# Patient Record
Sex: Male | Born: 1965
Health system: Southern US, Community
[De-identification: ages and names within clinical notes are randomized; demographics above are authoritative.]

## PROBLEM LIST (undated history)

## (undated) DIAGNOSIS — D839 Common variable immunodeficiency, unspecified: Secondary | ICD-10-CM

## (undated) DIAGNOSIS — D819 Combined immunodeficiency, unspecified: Secondary | ICD-10-CM

## (undated) DIAGNOSIS — M6530 Trigger finger, unspecified finger: Secondary | ICD-10-CM

## (undated) DIAGNOSIS — K219 Gastro-esophageal reflux disease without esophagitis: Secondary | ICD-10-CM

## (undated) DIAGNOSIS — N200 Calculus of kidney: Secondary | ICD-10-CM

## (undated) DIAGNOSIS — M5430 Sciatica, unspecified side: Secondary | ICD-10-CM

## (undated) DIAGNOSIS — R5383 Other fatigue: Secondary | ICD-10-CM

## (undated) DIAGNOSIS — G894 Chronic pain syndrome: Secondary | ICD-10-CM

## (undated) DIAGNOSIS — M92529 Juvenile osteochondrosis of tibia tubercle, unspecified leg: Secondary | ICD-10-CM

## (undated) DIAGNOSIS — E039 Hypothyroidism, unspecified: Secondary | ICD-10-CM

## (undated) DIAGNOSIS — I499 Cardiac arrhythmia, unspecified: Secondary | ICD-10-CM

## (undated) DIAGNOSIS — K760 Fatty (change of) liver, not elsewhere classified: Secondary | ICD-10-CM

## (undated) DIAGNOSIS — K117 Disturbances of salivary secretion: Secondary | ICD-10-CM

## (undated) DIAGNOSIS — F418 Other specified anxiety disorders: Secondary | ICD-10-CM

## (undated) DIAGNOSIS — R918 Other nonspecific abnormal finding of lung field: Secondary | ICD-10-CM

## (undated) DIAGNOSIS — G4733 Obstructive sleep apnea (adult) (pediatric): Secondary | ICD-10-CM

## (undated) DIAGNOSIS — M9250 Juvenile osteochondrosis of tibia and fibula, unspecified leg: Secondary | ICD-10-CM

## (undated) DIAGNOSIS — M722 Plantar fascial fibromatosis: Secondary | ICD-10-CM

## (undated) DIAGNOSIS — D539 Nutritional anemia, unspecified: Secondary | ICD-10-CM

## (undated) DIAGNOSIS — C8590 Non-Hodgkin lymphoma, unspecified, unspecified site: Secondary | ICD-10-CM

## (undated) DIAGNOSIS — E7800 Pure hypercholesterolemia, unspecified: Secondary | ICD-10-CM

## (undated) DIAGNOSIS — D800 Hereditary hypogammaglobulinemia: Secondary | ICD-10-CM

## (undated) DIAGNOSIS — J45909 Unspecified asthma, uncomplicated: Secondary | ICD-10-CM

## (undated) DIAGNOSIS — R682 Dry mouth, unspecified: Secondary | ICD-10-CM

## (undated) DIAGNOSIS — J449 Chronic obstructive pulmonary disease, unspecified: Secondary | ICD-10-CM

## (undated) HISTORY — PX: OTHER SURGICAL HISTORY: SHX169

## (undated) HISTORY — PX: TYMPANOSTOMY TUBE PLACEMENT: SHX32

## (undated) HISTORY — DX: Other nonspecific abnormal finding of lung field: R91.8

## (undated) HISTORY — DX: Combined immunodeficiency, unspecified: D81.9

## (undated) HISTORY — DX: Dry mouth, unspecified: R68.2

## (undated) HISTORY — DX: Pure hypercholesterolemia, unspecified: E78.00

## (undated) HISTORY — DX: Chronic pain syndrome: G89.4

## (undated) HISTORY — DX: Other specified anxiety disorders: F41.8

## (undated) HISTORY — DX: Other fatigue: R53.83

## (undated) HISTORY — DX: Chronic obstructive pulmonary disease, unspecified: J44.9

## (undated) HISTORY — DX: Fatty (change of) liver, not elsewhere classified: K76.0

## (undated) HISTORY — DX: Hypothyroidism, unspecified: E03.9

## (undated) HISTORY — DX: Non-Hodgkin lymphoma, unspecified, unspecified site: C85.90

## (undated) HISTORY — DX: Juvenile osteochondrosis of tibia and fibula, unspecified leg: M92.50

## (undated) HISTORY — DX: Juvenile osteochondrosis of tibia tubercle, unspecified leg: M92.529

## (undated) HISTORY — DX: Cardiac arrhythmia, unspecified: I49.9

## (undated) HISTORY — DX: Sciatica, unspecified side: M54.30

## (undated) HISTORY — PX: LYMPHADENECTOMY: SHX15

## (undated) HISTORY — DX: Plantar fascial fibromatosis: M72.2

## (undated) HISTORY — DX: Common variable immunodeficiency, unspecified: D83.9

## (undated) HISTORY — DX: Unspecified asthma, uncomplicated: J45.909

## (undated) HISTORY — DX: Calculus of kidney: N20.0

## (undated) HISTORY — DX: Gastro-esophageal reflux disease without esophagitis: K21.9

## (undated) HISTORY — PX: LIVER SURGERY: SHX698

## (undated) HISTORY — DX: Trigger finger, unspecified finger: M65.30

## (undated) HISTORY — DX: Disturbances of salivary secretion: K11.7

## (undated) HISTORY — DX: Obstructive sleep apnea (adult) (pediatric): G47.33

## (undated) HISTORY — DX: Hereditary hypogammaglobulinemia: D80.0

## (undated) HISTORY — DX: Nutritional anemia, unspecified: D53.9

---

## 2012-04-07 ENCOUNTER — Other Ambulatory Visit (HOSPITAL_COMMUNITY)
Admission: RE | Admit: 2012-04-07 | Discharge: 2012-04-07 | Disposition: A | Discharge status: Home / Self Care | Payer: Medicare PPO | Source: Ambulatory Visit | Attending: Pathology | Admitting: Pathology

## 2012-04-07 DIAGNOSIS — R5381 Other malaise: Secondary | ICD-10-CM | POA: Insufficient documentation

## 2012-04-07 DIAGNOSIS — D849 Immunodeficiency, unspecified: Secondary | ICD-10-CM | POA: Insufficient documentation

## 2012-04-07 DIAGNOSIS — R5383 Other fatigue: Secondary | ICD-10-CM | POA: Insufficient documentation

## 2012-11-12 ENCOUNTER — Other Ambulatory Visit: Payer: Self-pay | Admitting: Family Medicine

## 2012-11-16 ENCOUNTER — Other Ambulatory Visit: Payer: Self-pay | Admitting: Family Medicine

## 2013-10-07 DIAGNOSIS — M79609 Pain in unspecified limb: Secondary | ICD-10-CM | POA: Insufficient documentation

## 2014-03-07 DIAGNOSIS — J4550 Severe persistent asthma, uncomplicated: Secondary | ICD-10-CM | POA: Insufficient documentation

## 2014-03-07 DIAGNOSIS — M549 Dorsalgia, unspecified: Secondary | ICD-10-CM | POA: Insufficient documentation

## 2014-03-07 DIAGNOSIS — G4700 Insomnia, unspecified: Secondary | ICD-10-CM | POA: Insufficient documentation

## 2014-03-07 DIAGNOSIS — J479 Bronchiectasis, uncomplicated: Secondary | ICD-10-CM | POA: Insufficient documentation

## 2014-03-07 DIAGNOSIS — D649 Anemia, unspecified: Secondary | ICD-10-CM | POA: Insufficient documentation

## 2014-03-07 DIAGNOSIS — E039 Hypothyroidism, unspecified: Secondary | ICD-10-CM | POA: Insufficient documentation

## 2014-03-07 DIAGNOSIS — F419 Anxiety disorder, unspecified: Secondary | ICD-10-CM

## 2014-03-07 DIAGNOSIS — K219 Gastro-esophageal reflux disease without esophagitis: Secondary | ICD-10-CM | POA: Insufficient documentation

## 2014-03-07 DIAGNOSIS — F329 Major depressive disorder, single episode, unspecified: Secondary | ICD-10-CM | POA: Insufficient documentation

## 2014-03-07 DIAGNOSIS — G894 Chronic pain syndrome: Secondary | ICD-10-CM | POA: Insufficient documentation

## 2014-03-07 DIAGNOSIS — J45909 Unspecified asthma, uncomplicated: Secondary | ICD-10-CM | POA: Insufficient documentation

## 2014-03-07 DIAGNOSIS — M5430 Sciatica, unspecified side: Secondary | ICD-10-CM | POA: Insufficient documentation

## 2014-06-08 DIAGNOSIS — M1990 Unspecified osteoarthritis, unspecified site: Secondary | ICD-10-CM | POA: Insufficient documentation

## 2014-07-31 ENCOUNTER — Encounter: Payer: Self-pay | Admitting: Critical Care Medicine

## 2014-08-01 ENCOUNTER — Encounter: Payer: Self-pay | Admitting: Critical Care Medicine

## 2014-08-01 ENCOUNTER — Ambulatory Visit (INDEPENDENT_AMBULATORY_CARE_PROVIDER_SITE_OTHER): Payer: Medicare PPO | Admitting: Critical Care Medicine

## 2014-08-01 VITALS — BP 112/66 | HR 91 | Temp 97.7°F | Ht 66.0 in | Wt 189.4 lb

## 2014-08-01 DIAGNOSIS — D819 Combined immunodeficiency, unspecified: Secondary | ICD-10-CM | POA: Insufficient documentation

## 2014-08-01 DIAGNOSIS — D839 Common variable immunodeficiency, unspecified: Secondary | ICD-10-CM

## 2014-08-01 DIAGNOSIS — K760 Fatty (change of) liver, not elsewhere classified: Secondary | ICD-10-CM | POA: Insufficient documentation

## 2014-08-01 DIAGNOSIS — R05 Cough: Secondary | ICD-10-CM

## 2014-08-01 DIAGNOSIS — R059 Cough, unspecified: Secondary | ICD-10-CM

## 2014-08-01 DIAGNOSIS — K219 Gastro-esophageal reflux disease without esophagitis: Secondary | ICD-10-CM

## 2014-08-01 DIAGNOSIS — E7800 Pure hypercholesterolemia, unspecified: Secondary | ICD-10-CM | POA: Insufficient documentation

## 2014-08-01 DIAGNOSIS — J329 Chronic sinusitis, unspecified: Secondary | ICD-10-CM

## 2014-08-01 MED ORDER — FLUTICASONE PROPIONATE 50 MCG/ACT NA SUSP: 2.0000 | Freq: Two times a day (BID) | NASAL | Status: DC

## 2014-08-01 MED ORDER — DOXYCYCLINE HYCLATE 100 MG PO TABS: 100.0000 mg | ORAL_TABLET | Freq: Two times a day (BID) | ORAL | Status: DC

## 2014-08-01 NOTE — Assessment & Plan Note (Signed)
Severe gastroesophageal reflux disease exacerbating cough Will increase in acid program and have given patient instructions as to proper reflux diet

## 2014-08-01 NOTE — Patient Instructions (Signed)
Stop Voltaren Stop Qvar for now Stay on symbicort, use new spacer Stay on protonix, use 1/2 hour before meals Stay on ranitidine at bedtime Take doxycycline 127m twice daily for 10days for sinus infection Use NMilta DeitersMed sinus rinse twice a day for 10days, use distilled water, not tap water A CT Sinus will be obtained  Pulmonary function studies will be obtained Labs : IgG, IgA, IgM, IgE and allergy panel, alpha one anti trypsin levels An overnight oxygen study will be obtained on Room air Return 6 weeks

## 2014-08-01 NOTE — Assessment & Plan Note (Signed)
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

## 2014-08-01 NOTE — Progress Notes (Signed)
Subjective:    Patient ID: Isaac Walsh, male    DOB: 1965/12/31, 48 y.o.   MRN: [id number]  HPI Comments: Chronic cough x 6 months. Not as bad now as before but worse off ABX.  This past year Rx Hyzentra for three years.  Prior hx of PNA 6-7 x years None since started Hyzentra.  But pt ill in 01/2014. Since then dyspnea and cough.  Recently increased to 139m once a week.  Bruising across ant abdomen.    Rx levaquin/ zpak etc/ avelox and just off biaxin two days ago. Now on Breo powder x 15 days. Symbicort and singulair and qvar  Cough This is a chronic problem. The current episode started more than 1 year ago. The problem has been gradually improving. The problem occurs every few minutes. The cough is productive of sputum (1.5 weeks ago was thick bright yellow to white now less ). Associated symptoms include chest pain, ear congestion, ear pain, a fever, headaches, heartburn, nasal congestion, postnasal drip, rhinorrhea, a sore throat, shortness of breath and wheezing. Pertinent negatives include no chills or hemoptysis. The symptoms are aggravated by fumes and dust. He has tried steroid inhaler and a beta-agonist inhaler for the symptoms. The treatment provided no relief. His past medical history is significant for asthma, bronchitis, COPD, environmental allergies and pneumonia. There is no history of bronchiectasis or emphysema. skin tests neg for allergies  on oxygen 3L 24/7 . X 241yr Past Medical History  Diagnosis Date  . Abnormal heart rhythm   . Hypothyroidism   . Trigger finger   . COPD (chronic obstructive pulmonary disease)   . Chronic pain syndrome   . Common variable immunodeficiency   . Congenital dysgammaglobulinemia   . Depression with anxiety   . Fatigue   . GERD (gastroesophageal reflux disease)   . Hypercholesterolemia   . Nonalcoholic fatty liver disease   . OSA (obstructive sleep apnea)   . Osgood-Schlatter's disease   . Other nonspecific abnormal finding  of lung field   . SCID (severe combined immunodeficiency disease)   . Unspecified deficiency anemia   . Xerostomia   . Renal stones   . Plantar fasciitis   . Sciatica   . Lymphoma     as a child     Family History  Problem Relation Age of Onset  . Heart disease Father   . Colon cancer Father   . Stroke      grandfather     History   Social History  . Marital Status: Single    Spouse Name: N/A    Number of Children: N/A  . Years of Education: N/A   Occupational History  . Not on file.   Social History Main Topics  . Smoking status: Former Smoker -- 1.00 packs/day for 10 years    Types: Cigarettes    Quit date: 12/29/1994  . Smokeless tobacco: Former [employer] developer  Types: [place]: 12/29/1978  . Alcohol Use: Yes     Comment: 2-3 beers per wk  . Drug Use: No  . Sexual Activity: Not on file   Other Topics Concern  . Not on file   Social History Narrative  . No narrative on file     Allergies  Allergen Reactions  . Androgel Pump [Testosterone]     rash  . Ceclor [Cefaclor]   . Celestone [Betamethasone]   . Cephalosporins Hives  . Claritin [Loratadine]   . Corticosteroids Hives  .  Flumist [Flu Virus Vaccine]     Pt reports he can tolerate flu shot but not mist  . Naprosyn [Naproxen]   . Tizanidine   . Toradol [Ketorolac Tromethamine]   . Vicodin [Hydrocodone-Acetaminophen]   . Rocephin [Ceftriaxone] Hives and Rash     Outpatient Prescriptions Prior to Visit  Medication Sig Dispense Refill  . acyclovir (ZOVIRAX) 400 MG tablet Take 400 mg by mouth 5 (five) times daily. As needed      . budesonide-formoterol (SYMBICORT) 160-4.5 MCG/ACT inhaler Inhale 2 puffs into the lungs 2 (two) times daily.      . carisoprodol (SOMA) 350 MG tablet Take 350 mg by mouth every 6 (six) hours as needed for muscle spasms.       . Fe Fum-FA-B Cmp-C-Zn-Mg-Mn-Cu (HEMATINIC PLUS COMPLEX) 106-1 MG TABS Take 1 capsule by mouth daily.      . finasteride (PROPECIA) 1 MG tablet  Take 1 mg by mouth daily.      Marland Kitchen ipratropium-albuterol (DUONEB) 0.5-2.5 (3) MG/3ML SOLN Take 3 mLs by nebulization every 6 (six) hours as needed.      Marland Kitchen levothyroxine (SYNTHROID, LEVOTHROID) 100 MCG tablet Take 100 mcg by mouth daily before breakfast.      . LORazepam (ATIVAN) 1 MG tablet Take 1 mg by mouth at bedtime.      . OxyCODONE (OXYCONTIN) 20 mg T12A 12 hr tablet Take 20 mg by mouth 3 (three) times daily.       . pantoprazole (PROTONIX) 40 MG tablet Take 40 mg by mouth 2 (two) times daily.      . ranitidine (ZANTAC) 150 MG capsule Take 300 mg by mouth every evening.      . sertraline (ZOLOFT) 100 MG tablet Take 100 mg by mouth daily.      . traZODone (DESYREL) 100 MG tablet Take 200 mg by mouth at bedtime.      Marland Kitchen zolpidem (AMBIEN) 10 MG tablet Take 10 mg by mouth at bedtime.       . montelukast (SINGULAIR) 10 MG tablet Take 10 mg by mouth at bedtime.      . beclomethasone (QVAR) 40 MCG/ACT inhaler Inhale 2 puffs into the lungs 4 (four) times daily.      . Diclofenac-Misoprostol (ARTHROTEC) 50-0.2 MG TBEC Take 1 tablet by mouth daily.      Marland Kitchen oxyCODONE-acetaminophen (PERCOCET) 10-325 MG per tablet Take 1 tablet by mouth every 6 (six) hours as needed for pain.       No facility-administered medications prior to visit.      Review of Systems  Constitutional: Positive for fever and fatigue. Negative for chills.  HENT: Positive for ear pain, postnasal drip, rhinorrhea, sinus pressure, sore throat, trouble swallowing and voice change.   Respiratory: Positive for cough, chest tightness, shortness of breath and wheezing. Negative for hemoptysis.   Cardiovascular: Positive for chest pain.  Gastrointestinal: Positive for heartburn.       Severe gerd   Allergic/Immunologic: Positive for environmental allergies.  Neurological: Positive for headaches.       Objective:   Physical Exam  Filed Vitals:   08/01/14 1529  BP: 112/66  Pulse: 91  Temp: 97.7 F (36.5 C)  TempSrc: Oral    Height: _0  (1.676 m)  Weight: 189 lb 6.4 oz (85.911 kg)  SpO2: 97%    Gen: Pleasant, well-nourished, in no distress,  normal affect  ENT: No lesions,  mouth clear,  oropharynx clear, no postnasal drip  Neck: No JVD, no TMG,  no carotid bruits  Lungs: No use of accessory muscles, no dullness to percussion, distant breath sounds and coarse in nature   Cardiovascular: RRR, heart sounds normal, no murmur or gallops, no peripheral edema  Abdomen: soft and NT, no HSM,  BS normal  Musculoskeletal: No deformities, no cyanosis or clubbing  Neuro: alert, non focal  Skin: Warm, no lesions or rashes  No results found.  CT scan of chest is reviewed and reveals right middle lobe and lingular scarring but no mass and no evidence of bronchiectasis  Incomplete database, need to obtain records from allergy office      Assessment & Plan:   Cough Cyclical cough with airway reactivity and associated airflow obstruction. Chronic scarring in right middle and left upper lung zones from prior pneumonia. History of immunodeficiency with combined variable immune globulin deficiencies.  Significant reflux component.  Current acute sinusitis and CT scan evidence of prior chronic sinusitis.  Prior smoking-induced lung disease now off tobacco products at this time.   Nonsteroidal anti-inflammatories are likely exacerbating cough by mechanism of increasing reflux disease Multiple inhaled steroids are also in increasing cough by upper airway irritation We need records from the allergy office Plan Stop Voltaren Stop Qvar for now Stay on symbicort, use new spacer Stay on protonix, use 1/2 hour before meals Stay on ranitidine at bedtime Take doxycycline 177m twice daily for 10days for sinus infection Use Neil Med sinus rinse twice a day for 10days, use distilled water, not tap water A CT Sinus will be obtained  Pulmonary function studies will be obtained Labs : IgG, IgA, IgM, IgE and allergy panel,  alpha one anti trypsin levels An overnight oxygen study will be obtained on Room air Return 6 weeks    SCID (severe combined immunodeficiency disease) 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 acyclovir (ZOVIRAX) 400 MG tablet Take 400 mg by mouth 5 (five) times daily. As needed  . budesonide-formoterol (SYMBICORT) 160-4.5 MCG/ACT inhaler Inhale 2 puffs into the lungs 2 (two) times daily.  . carisoprodol (SOMA) 350 MG tablet Take 350 mg by mouth every 6 (six) hours as needed for muscle spasms.   . Fe Fum-FA-B Cmp-C-Zn-Mg-Mn-Cu (HEMATINIC PLUS COMPLEX) 106-1 MG TABS Take 1 capsule by mouth daily.  . finasteride (PROPECIA) 1 MG tablet Take 1 mg by mouth daily.  . Immune Globulin, Human, (HIZENTRA [employer]) Inject into the skin once a week.  .Marland Kitchenipratropium-albuterol (DUONEB) 0.5-2.5 (3) MG/3ML SOLN Take 3 mLs by nebulization every 6 (six) hours as needed.  .Marland Kitchenlevothyroxine (SYNTHROID, LEVOTHROID) 100 MCG tablet Take 100 mcg by mouth daily before breakfast.  . LORazepam (ATIVAN) 1 MG tablet Take 1 mg by mouth at bedtime.  . OxyCODONE (OXYCONTIN) 20 mg T12A 12 hr tablet Take 20 mg by mouth 3 (three) times daily.   . Oxycodone HCl 10 MG TABS Take 10 mg by mouth 4 (four) times daily.  . pantoprazole (PROTONIX) 40 MG tablet Take 40 mg by mouth 2 (two) times daily.  .Vladimir FasterGlycol-Propyl Glycol (SYSTANE OP) Apply to eye every 4 (four) hours.  . ranitidine (ZANTAC) 150 MG capsule Take 300 mg by mouth every evening.  . sertraline (ZOLOFT)  100 MG tablet Take 100 mg by mouth daily.  . traZODone (DESYREL) 100 MG tablet Take 200 mg by mouth at bedtime.   Marland Kitchen zolpidem (AMBIEN) 10 MG tablet Take 10 mg by mouth at bedtime.   . [DISCONTINUED] beclomethasone (QVAR) 80 MCG/ACT inhaler Inhale 2 puffs into the lungs 2 (two) times daily.  . [DISCONTINUED] diclofenac (VOLTAREN) 75 MG EC tablet Take 75 mg by mouth 2 (two) times daily.  . [DISCONTINUED] montelukast (SINGULAIR) 10 MG tablet Take 10 mg by mouth at bedtime.  Marland Kitchen doxycycline (VIBRA-TABS) 100 MG tablet Take 1 tablet (100 mg total) by mouth 2 (two) times daily.  . fluticasone (FLONASE) 50 MCG/ACT nasal spray Place 2 sprays into both nostrils 2 (two) times daily.  . [DISCONTINUED] beclomethasone (QVAR) 40 MCG/ACT inhaler Inhale 2 puffs into the lungs 4 (four) times daily.  . [DISCONTINUED] Diclofenac-Misoprostol (ARTHROTEC) 50-0.2 MG TBEC Take 1 tablet by mouth daily.  . [DISCONTINUED] oxyCODONE-acetaminophen (PERCOCET) 10-325 MG per tablet Take 1 tablet by mouth every 6 (six) hours as needed for pain.

## 2014-08-01 NOTE — Assessment & Plan Note (Signed)
Cyclical cough with airway reactivity and associated airflow obstruction. Chronic scarring in right middle and left upper lung zones from prior pneumonia. History of immunodeficiency with combined variable immune globulin deficiencies.  Significant reflux component.  Current acute sinusitis and CT scan evidence of prior chronic sinusitis.  Prior smoking-induced lung disease now off tobacco products at this time.   Nonsteroidal anti-inflammatories are likely exacerbating cough by mechanism of increasing reflux disease Multiple inhaled steroids are also in increasing cough by upper airway irritation We need records from the allergy office Plan Stop Voltaren Stop Qvar for now Stay on symbicort, use new spacer Stay on protonix, use 1/2 hour before meals Stay on ranitidine at bedtime Take doxycycline 119m twice daily for 10days for sinus infection Use NMilta DeitersMed sinus rinse twice a day for 10days, use distilled water, not tap water A CT Sinus will be obtained  Pulmonary function studies will be obtained Labs : IgG, IgA, IgM, IgE and allergy panel, alpha one anti trypsin levels An overnight oxygen study will be obtained on Room air Return 6 weeks

## 2014-08-04 ENCOUNTER — Telehealth: Payer: Self-pay | Admitting: Critical Care Medicine

## 2014-08-04 NOTE — Telephone Encounter (Signed)
Called spoke with Richard from [employer]. He reports he needs more specifics on the allergy full profile they received. They do not use this order as it is to vague. They use [facility] and they do not send labs out to [facility]. Spoke with Crystal J as Dr. Joya Gaskins has ordered these on previous patients. On previous patients that had allergy profile ordered, they had codes on their form for 002170, [id number], [phone]. Code 128786 is for IGE LEVEL Code 767209 is for Zone 3 allergens in [place] Code 470962 is for basic food allergies.  Per Richard they test for Zone 2 and/or Zone 3 allergens in [place]. If Dr. Joya Gaskins is wanting both ordered, we will need to include these codes on the orderform as well. Also on the new order form, they also need order for IgG, IgA, IgM as this is not in their records per Richard.  This has been faxed to them and crystal will discuss this with PW next week.

## 2014-08-04 NOTE — Telephone Encounter (Signed)
Called and spoke with pt and he stated that he woke up this morning with his head hurting.  He stated that he has been taking the doxy and using the nasal washes. He is not sure if the doxy is the cause of the severe head pain this morning.   He is not able to use the flonase since this causes him to have nose bleeds.    Pt wanted to see how long he should stay off of the qvar?  He has been using the symbicort.    Pt stated that he is going for his labs and PFT now, but the CT and sleep study has not been scheduled yet due to insurance issues.   PW please advise. thanks

## 2014-08-06 NOTE — Telephone Encounter (Signed)
Stay off qvar Finish doxy Keep other appts

## 2014-08-07 NOTE — Telephone Encounter (Signed)
I called made pt aware. Nothing further needed 

## 2014-08-07 NOTE — Telephone Encounter (Signed)
LMOMTCB x 1 

## 2014-08-07 NOTE — Telephone Encounter (Signed)
PW aware.

## 2014-08-07 NOTE — Telephone Encounter (Signed)
Called spoke w/ pt. Aware of PW recs. He reports since stopping the voltaren he has been experiencing stomach cramps/swelling. He reports at times it can be painful. He wants to restart this medication. Please advise thanks

## 2014-08-07 NOTE — Telephone Encounter (Signed)
i am ok with restarting the voltaren

## 2014-08-08 ENCOUNTER — Telehealth: Payer: Self-pay | Admitting: Critical Care Medicine

## 2014-08-08 ENCOUNTER — Encounter: Payer: Self-pay | Admitting: Critical Care Medicine

## 2014-08-08 MED ORDER — MOXIFLOXACIN HCL 400 MG PO TABS: 400.0000 mg | ORAL_TABLET | Freq: Every day | ORAL | Status: DC

## 2014-08-08 NOTE — Telephone Encounter (Signed)
Tell pt pfts show moderate to severe obstruction No change in medications Allergy test blood work not completely back but so far negative for severe allergies  I have reviewed Dr Lyndon Code records

## 2014-08-08 NOTE — Telephone Encounter (Signed)
Call in avelox 446m daily x 10days

## 2014-08-08 NOTE — Telephone Encounter (Signed)
Called, spoke with pt.  Informed him of below results and recs per Dr. Joya Gaskins.  He verbalized understanding.  PFT results placed in scan folder.  1.  Pt states he feels worse and doesn't feel like doxy is helping.  He started this on Aug 5 - has approx 4 days left.  C/op increased cough x 3-4 days with small amount of pale yellow mucus, "extreme" nasal stuffiness, and peircing pain and tension in back from coughing.  Pt reports doxy has not worked for him in the past.  States symptoms "WILL TURN into pna" if not tx.  Requesting further recs.  Dr. Joya Gaskins, pls advise.  Thank you. 2.  Reports CT Sinus was not done on 8/7 and was cancelled by hospital bc it is still awaiting approval from insurance co.  He also has not heard anything to have ONO done.  PCCs, will you pls assist to see where we are on this scan and the ONO order?  Thank you.  ** We are still awaiting alpha 1 results, and IgG, IgA, and IgM results.  I have orders to follow up on.

## 2014-08-08 NOTE — Telephone Encounter (Signed)
Avelox sent to [facility] in [place] - pt aware and verbalized understanding of instructions. He is aware we will be contacting him back regarding CT Sinus and ONO. PCCs, pls advise.  Thank you.

## 2014-08-09 NOTE — Telephone Encounter (Signed)
This is a [place] orders do i need to do it

## 2014-08-09 NOTE — Telephone Encounter (Signed)
Faxed ono to [facility] and i noticed ct was scheduled and precert for [facility] [phone]

## 2014-08-14 NOTE — Telephone Encounter (Signed)
Crystal those are [place] orders i don't know if they were don

## 2014-08-14 NOTE — Telephone Encounter (Signed)
Libby, do you know if ONO order was received and if CT has been scheduled?  If so, is pt aware of appt date, time, and location?

## 2014-08-18 NOTE — Telephone Encounter (Signed)
Secure msg sent to Lattie Haw and Ailene Ravel in the [place] office to check on the status of the CT Sinus and ONO.  Will await response.

## 2014-08-23 NOTE — Telephone Encounter (Signed)
Received following msg via secure email from [place] in the [place] office.  Msg dated 08/22/14:  "I contacted Marissa Calamity today to inform inform hiim of the following: CT Chest was rescheduled at [facility] for 08/25/14 at 9:15 - Pt to arrive by 8:45.  I verified that the overnight oximetry had been completed -Per patient that was done on 08/15/14.  Patient reports not feeling any better at this time despite completing the Avalox medication."    Alverda Skeans, [phone], spoke with Wells Guiles.  ONO was completed.  I have now placed these results in Dr. Bettina Gavia folder along with the below pending labs for PW to address when he returns to office.    Called, spoke with pt to follow up on symptoms reported to [place]. 1.  Pt states he doesn't feel any better after completing 10 day coarse of Avelox.  Took last dose on Friday, Aug 21.  Is coughing "all the time."  Feels cough is worse than it was when he was seen by PW.  Cough is prod with "rusty" colored mucus.  He is "completely congested" in head and chest, has SOB when walking across room and when going upstairs (feels SOB is a little worse since last OV with PW), and reports it "hurts to breath with any inhalation" - this is worse on right side.  Wheezing at times when exhales.  Also notes fatigue but is unsure if this is from a new IV med started for immunity.  Feels "hot all the time" but denies chills or fever.  Is using symbicort 160 2 puffs bid, guaifenesin 400 mg bid, a sinus medication 1-2 times daily, and duoneb prn.  Has "a little relief" when uses duoneb.  Pt states Avelox never works for him.  Reports he usually needs either levaquin or zpak.  Offered OV in [place] office as PW not in [place] office until Sept 1 and symptoms have worsened  Pt declined OV at this time - reports he is unable to come in for OV at anytime this wk.  Advised would send msg to doc of the day to address above symptoms.  Pt declined this as well.  Pt requests "not  to get too many doctors involved" and would like to wait until PW returns to office tomorrow to address message.  CT Sinus is scheduled for this Friday, Aug 28.  Pt has a pending OV with PW on 09/05/14 in [place].  Pt is aware to seek emergency care if needed. 2.  Pt reports he is allergic to Avelox.  States this caused him to have blisters on his face, and "it just doesn't work for me."  Pt would like medication added to allergy list.  This has been done.  Pt aware.   Dr. Joya Gaskins, pls advise on ONO/lab results and of any further recs for pt regarding above symptoms?  If OV is required, pt is requesting to be worked into schedule on Sept 1 in [place].  Please advise.  Thank you.

## 2014-08-24 NOTE — Telephone Encounter (Signed)
Called, spoke with pt.  Explained below to him.  He verbalized understanding and would like to speak with Dr. Joya Gaskins.  He has several questions to ask.  Pt aware PW to attempt to call him tomorrow.  CT is scheduled for 12:30 tomorrow per pt.

## 2014-08-24 NOTE — Telephone Encounter (Signed)
Per PW:  Pt's ONO is normal.  Alpha 1 antitryspin is normal.  IgG is ok.  IgA is low <6.  PW does not want to give more abx right now -- pt will need to have CT Sinus first and will proceed from there.  I will call pt to discuss later today.  PW will attempt to call pt tomorrow to follow up as well.    Results placed in scan folder.

## 2014-08-25 ENCOUNTER — Telehealth: Payer: Self-pay | Admitting: Critical Care Medicine

## 2014-08-25 MED ORDER — LEVOFLOXACIN 750 MG PO TABS: 750.0000 mg | ORAL_TABLET | Freq: Every day | ORAL | Status: DC

## 2014-08-25 NOTE — Telephone Encounter (Signed)
Called, spoke with pt.  Informed him of below per Dr. Joya Gaskins. He verbalized understanding and is aware levaquin rx sent to [facility] in [place]. Dr. Joya Gaskins, pt would like to know what his lowest o2 sat was during ONO.

## 2014-08-25 NOTE — Telephone Encounter (Signed)
I spoke to the pt earlier, let him know I reviewed his sinus ct and while there are no airfluid levels to suggest a need to surgically drain the sinuses, he does likely have some sinusitis.  I recommend 10days of levaquin 720m daily  Ok to call this in  Tell him i will confer with dr kNeldon Mcnext week

## 2014-08-25 NOTE — Telephone Encounter (Signed)
i spoke to the pt. Am awaiting Ct sinus before other abx issued.  Pt aware

## 2014-08-26 NOTE — Telephone Encounter (Signed)
About 90

## 2014-08-28 NOTE — Telephone Encounter (Signed)
lmomtcb for pt on home and cell #s

## 2014-08-29 NOTE — Telephone Encounter (Signed)
Called, spoke with pt.  Informed him of below per Dr. Joya Gaskins.  He verbalized understanding. Pt is aware of pending OV with PW on Sept 8 at 2:45 pm in [place]. He is to call office back if needed prior to appt.

## 2014-09-05 ENCOUNTER — Encounter: Payer: Self-pay | Admitting: Critical Care Medicine

## 2014-09-05 ENCOUNTER — Ambulatory Visit (INDEPENDENT_AMBULATORY_CARE_PROVIDER_SITE_OTHER): Payer: Medicare PPO | Admitting: Critical Care Medicine

## 2014-09-05 VITALS — BP 142/78 | HR 78 | Temp 97.7°F | Ht 66.0 in | Wt 192.2 lb

## 2014-09-05 DIAGNOSIS — J324 Chronic pansinusitis: Secondary | ICD-10-CM

## 2014-09-05 DIAGNOSIS — J4550 Severe persistent asthma, uncomplicated: Secondary | ICD-10-CM

## 2014-09-05 DIAGNOSIS — D802 Selective deficiency of immunoglobulin A [IgA]: Secondary | ICD-10-CM | POA: Insufficient documentation

## 2014-09-05 DIAGNOSIS — D72810 Lymphocytopenia: Secondary | ICD-10-CM | POA: Insufficient documentation

## 2014-09-05 DIAGNOSIS — J45909 Unspecified asthma, uncomplicated: Secondary | ICD-10-CM

## 2014-09-05 DIAGNOSIS — J3489 Other specified disorders of nose and nasal sinuses: Secondary | ICD-10-CM

## 2014-09-05 DIAGNOSIS — D819 Combined immunodeficiency, unspecified: Secondary | ICD-10-CM

## 2014-09-05 DIAGNOSIS — K219 Gastro-esophageal reflux disease without esophagitis: Secondary | ICD-10-CM

## 2014-09-05 DIAGNOSIS — E669 Obesity, unspecified: Secondary | ICD-10-CM | POA: Insufficient documentation

## 2014-09-05 NOTE — Patient Instructions (Addendum)
Stay on singulair and symbicort Keep sugar free candy in mouth as much as possible, train self to swallow instead of clearing throat Continue with Intravenous gamma globulin Stay on ranitidine and pantoprazole No further antibiotics for now  A referral to [facility] ent dr Melida Quitter will be made Return 2 months

## 2014-09-05 NOTE — Assessment & Plan Note (Signed)
Fixed airflow obstruction with bronchiectasis Cyclic cough Plan  offered sugar free candy to moisturize throat and encourage swallow, not cough

## 2014-09-05 NOTE — Assessment & Plan Note (Signed)
Chronic airflow obstruction and asthma , note IgE normal. Alpha one antitrypsin normal Note presence of bronchiectasis RML L lingula Plan Cont symbicort  Cont singulair Stop qvar

## 2014-09-05 NOTE — Assessment & Plan Note (Signed)
SCID with low IgG and IgA  Now on IV gamma globulin qmonthly. Chronic lymphopenia Recurrent sino-pulmonary infections Recent sinusitis on Ct Sinus and exam Cyclical cough with bronchiectasis RML, L lingula Plan Cont symbicort Cont singulair Cont gamma globulin infusions ENT referral: specific question: any value in culture of sinuses? Elimination of crusting in sinuses?  ?cause of nasal perforation. Pt denies snorting street drugs in past  Cont flonase Cont sinus rinse Hold further ABX for now Stay off Qvar Avoid systemic steroids Discussed with Dr Neldon Mc 09/05/2014: no value in a referral to [facility]

## 2014-09-05 NOTE — Progress Notes (Signed)
Subjective:    Patient ID: Isaac Walsh, male    DOB: July 18, 1966, 48 y.o.   MRN: [id number]  HPI Comments: Chronic cough x 6 months. Not as bad now as before but worse off ABX.  This past year Rx Hyzentra for three years.  Prior hx of PNA 6-7 x years None since started Hyzentra.  But pt ill in 01/2014. Since then dyspnea and cough.  Recently increased to 161m once a week.  Bruising across ant abdomen.    Rx levaquin/ zpak etc/ avelox and just off biaxin two days ago. Now on Breo powder x 15 days. Symbicort and singulair and qvar on oxygen 3L 24/7 . X 258yr 09/05/2014 Chief Complaint  Patient presents with  . 6 wk follow up    Cough is unchanged with rusty colored mucus.    At last OV we discerned: IgA undetectable IgG >1000 IgE 6, no pos allergens on RAST Sinus ct: mild sinusitis pfts : moderate obstruction: FeV1 57% 30% better with BDs ONO RA: abn, needs qhs oxygen CT chest 05/2014: RML L lingular scar, assoc  Bronchiectasis same areas, no interstital lung dz Has been on several rounds of ABX: avelox/levaquin/doxy Spoke to kozlow: [facility]  Pt still with cough.  Now on IV IgG last dose 08/11/14.    Cough is unchanged.  Not as much mucus.  Hard effort to get up mucus.  Prev sinus surgery , now with mild sinusitis on CT, has nasal perforation.    Review of Systems  Constitutional: Positive for fatigue.  HENT: Positive for sinus pressure, trouble swallowing and voice change.   Respiratory: Positive for chest tightness.   Gastrointestinal:       Severe gerd        Objective:   Physical Exam   Filed Vitals:   09/05/14 1451  BP: 142/78  Pulse: 78  Temp: 97.7 F (36.5 C)  TempSrc: Oral  Height: _0  (1.676 m)  Weight: 192 lb 3.2 oz (87.181 kg)  SpO2: 95%    Gen: Pleasant, well-nourished, in no distress,  normal affect  ENT: No lesions,  mouth clear,  oropharynx clear, no postnasal drip  Neck: No JVD, no TMG, no carotid  bruits  Lungs: No use of accessory muscles, no dullness to percussion, distant breath sounds and coarse in nature   Cardiovascular: RRR, heart sounds normal, no murmur or gallops, no peripheral edema  Abdomen: soft and NT, no HSM,  BS normal  Musculoskeletal: No deformities, no cyanosis or clubbing  Neuro: alert, non focal  Skin: Warm, no lesions or rashes  No results found.      Assessment & Plan:   SCID (severe combined immunodeficiency disease) SCID with low IgG and IgA  Now on IV gamma globulin qmonthly. Chronic lymphopenia Recurrent sino-pulmonary infections Recent sinusitis on Ct Sinus and exam Cyclical cough with bronchiectasis RML, L lingula Plan Cont symbicort Cont singulair Cont gamma globulin infusions ENT referral: specific question: any value in culture of sinuses? Elimination of crusting in sinuses?  ?cause of nasal perforation. Pt denies snorting street drugs in past  Cont flonase Cont sinus rinse Hold further ABX for now Stay off Qvar Avoid systemic steroids Discussed with Dr koNeldon Mc/07/2014: no value in a referral to [facility]   Asthma, severe persistent Chronic airflow obstruction and asthma , note IgE normal. Alpha one antitrypsin normal Note presence of bronchiectasis RML L lingula Plan Cont symbicort  Cont singulair Stop qvar  Bronchiectasis without complication Fixed airflow obstruction with bronchiectasis Cyclic cough Plan  offered sugar free candy to moisturize throat and encourage swallow, not cough  Acid reflux High level reflux exacerbating current cough  Cont ppi/h2 tx    Updated Medication List Outpatient Encounter Prescriptions as of 09/05/2014  Medication Sig  . acyclovir (ZOVIRAX) 400 MG tablet Take 400 mg by mouth 5 (five) times daily. As needed  . budesonide-formoterol (SYMBICORT) 160-4.5 MCG/ACT inhaler Inhale 2 puffs into the lungs 2 (two) times daily.  . carisoprodol (SOMA) 350 MG tablet Take 350 mg by mouth every 6 (six)  hours as needed for muscle spasms.   . diclofenac (VOLTAREN) 75 MG EC tablet Take 75 mg by mouth 2 (two) times daily.  . Fe Fum-FA-B Cmp-C-Zn-Mg-Mn-Cu (HEMATINIC PLUS COMPLEX) 106-1 MG TABS Take 1 capsule by mouth daily.  . finasteride (PROPECIA) 1 MG tablet Take 1 mg by mouth daily.  . Immune Globulin 10% 5 GM/50ML SOLN Inject 50 g into the vein every 30 (thirty) days.  Marland Kitchen ipratropium-albuterol (DUONEB) 0.5-2.5 (3) MG/3ML SOLN Take 3 mLs by nebulization every 6 (six) hours as needed.  Marland Kitchen levothyroxine (SYNTHROID, LEVOTHROID) 100 MCG tablet Take 100 mcg by mouth daily before breakfast.  . LORazepam (ATIVAN) 1 MG tablet Take 1 mg by mouth at bedtime.  . montelukast (SINGULAIR) 10 MG tablet Take 10 mg by mouth at bedtime.  . OxyCODONE (OXYCONTIN) 20 mg T12A 12 hr tablet Take 20 mg by mouth 3 (three) times daily.   . Oxycodone HCl 10 MG TABS Take 10 mg by mouth 4 (four) times daily.  . pantoprazole (PROTONIX) 40 MG tablet Take 40 mg by mouth 2 (two) times daily.  Vladimir Faster Glycol-Propyl Glycol (SYSTANE OP) Apply to eye every 4 (four) hours.  . ranitidine (ZANTAC) 150 MG capsule Take 300 mg by mouth every evening.  . sertraline (ZOLOFT) 100 MG tablet Take 50 mg by mouth daily.   . traZODone (DESYREL) 100 MG tablet Take 200 mg by mouth at bedtime.  Marland Kitchen zolpidem (AMBIEN) 10 MG tablet Take 10 mg by mouth at bedtime.   . fluticasone (FLONASE) 50 MCG/ACT nasal spray Place 2 sprays into both nostrils 2 (two) times daily.  . Immune Globulin, Human, (HIZENTRA [place]) Inject into the skin. ON HOLD for a few months per pt  . [DISCONTINUED] levofloxacin (LEVAQUIN) 750 MG tablet Take 1 tablet (750 mg total) by mouth daily.  . [DISCONTINUED] moxifloxacin (AVELOX) 400 MG tablet Take 1 tablet (400 mg total) by mouth daily.

## 2014-09-05 NOTE — Assessment & Plan Note (Signed)
High level reflux exacerbating current cough  Cont ppi/h2 tx

## 2014-11-07 ENCOUNTER — Ambulatory Visit (INDEPENDENT_AMBULATORY_CARE_PROVIDER_SITE_OTHER): Payer: Medicare PPO | Admitting: Critical Care Medicine

## 2014-11-07 ENCOUNTER — Encounter: Payer: Self-pay | Admitting: Critical Care Medicine

## 2014-11-07 VITALS — BP 120/80 | HR 86 | Temp 98.5°F | Ht 66.0 in | Wt 195.8 lb

## 2014-11-07 DIAGNOSIS — J4550 Severe persistent asthma, uncomplicated: Secondary | ICD-10-CM

## 2014-11-07 DIAGNOSIS — D802 Selective deficiency of immunoglobulin A [IgA]: Secondary | ICD-10-CM

## 2014-11-07 NOTE — Patient Instructions (Signed)
No medication changes for now Return 4 months There is very little more pulmonary can add to all the medications you are on within the constraints of your home environment and financial situation.

## 2014-11-07 NOTE — Assessment & Plan Note (Signed)
Severe persistent asthma with associated chronic bronchiectasis and immune deficiency syndrome Chronic sinusitis as well Plan Take prednisone 75m Take 4 for two days three for two days two for two days one for two days Take azithromycin 2583mTake two once then one daily until gone Stay on asmanex daily Start flonase two puff daily ea nostril Return 3 months

## 2014-11-07 NOTE — Progress Notes (Signed)
Subjective:    Patient ID: Isaac Walsh, male    DOB: 1966/06/25, 48 y.o.   MRN: [id number]  HPI Comments: Chronic cough x 6 months. Not as bad now as before but worse off ABX.  This past year Rx Hyzentra for three years.  Prior hx of PNA 6-7 x years None since started Hyzentra.  But pt ill in 01/2014. Since then dyspnea and cough.  Recently increased to 12m once a week.  Bruising across ant abdomen.    Rx levaquin/ zpak etc/ avelox and just off biaxin two days ago. Now on Breo powder x 15 days. Symbicort and singulair and qvar on oxygen 3L 24/7 . X 263yr 09/05/2014 Chief Complaint  Patient presents with  . 6 wk follow up    Cough is unchanged with rusty colored mucus.    At last OV we discerned: IgA undetectable IgG >1000 IgE 6, no pos allergens on RAST Sinus ct: mild sinusitis pfts : moderate obstruction: FeV1 57% 30% better with BDs ONO RA: abn, needs qhs oxygen CT chest 05/2014: RML L lingular scar, assoc  Bronchiectasis same areas, no interstital lung dz Has been on several rounds of ABX: avelox/levaquin/doxy Spoke to kozlow: does not feel [facility]  Pt still with cough.  Now on IV IgG last dose 08/11/14.    Cough is unchanged.  Not as much mucus.  Hard effort to get up mucus.  Prev sinus surgery , now with mild sinusitis on CT, has nasal perforation.  11/07/2014 Chief Complaint  Patient presents with  . Follow-up    Received IV tx yesterday,c/o sob worse x 2-3 days,finished Levaquin 1 wk. ago (primary MD),dry mouth,sweats,cough-thick,brown,streaks of bld. when blows nose at times,sinus pr. on left,ear on left side hurts, not sleeping well   [place] recommended tobramycin/nasal steroid irrigation of sinuses, could not afford.  Pt did the 45$ and helped a bit. Just saw PCP and Rx levaquin helped while on this and then relapsed.  Coughs more at home than other places. Just had gamma globulin injection 11/06/14 per  Kozlow Note this patient does live in a basement environment that quite wet and moldy Patient saw ENT but could not afford the steroid antibiotic irrigation that was prescribed The patient maintains inhaled medications  Review of Systems  Constitutional: Positive for fatigue.  HENT: Positive for sinus pressure, trouble swallowing and voice change.   Respiratory: Positive for chest tightness.   Gastrointestinal:       Severe gerd        Objective:   Physical Exam  Filed Vitals:   11/07/14 1204  BP: 120/80  Pulse: 86  Temp: 98.5 F (36.9 C)  TempSrc: Oral  Height: _0  (1.676 m)  Weight: 195 lb 12.8 oz (88.814 kg)  SpO2: 92%    Gen: Pleasant, well-nourished, in no distress,  normal affect  ENT: No lesions,  mouth clear,  oropharynx clear, no postnasal drip  Neck: No JVD, no TMG, no carotid bruits  Lungs: No use of accessory muscles, no dullness to percussion, distant breath sounds and coarse in nature   Cardiovascular: RRR, heart sounds normal, no murmur or gallops, no peripheral edema  Abdomen: soft and NT, no HSM,  BS normal  Musculoskeletal: No deformities, no cyanosis or clubbing  Neuro: alert, non focal  Skin: Warm, no lesions or rashes  No results found.      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 acyclovir (ZOVIRAX) 400 MG tablet Take 400 mg by mouth 5 (five) times daily. As needed  .  carisoprodol (SOMA) 350 MG tablet Take 350 mg by mouth every 6 (six) hours as needed for muscle spasms.   . diclofenac (VOLTAREN) 75 MG EC tablet Take 75 mg by mouth 2 (two) times daily.  . Fe Fum-FA-B Cmp-C-Zn-Mg-Mn-Cu (HEMATINIC PLUS COMPLEX) 106-1 MG TABS Take 1 capsule by mouth daily.  . finasteride (PROPECIA) 1 MG tablet Take 1 mg by mouth daily.  . Fluticasone Furoate-Vilanterol (BREO ELLIPTA IN) Inhale into the lungs. Take 1 puff every day  . Immune Globulin 10% 5 GM/50ML SOLN Inject 50 g into the vein every 30 (thirty) days.  . Marland Kitchenpratropium-albuterol (DUONEB) 0.5-2.5 (3) MG/3ML SOLN Take 3 mLs by nebulization every 6 (six) hours as needed.  . Marland Kitchenevothyroxine (SYNTHROID, LEVOTHROID) 100 MCG tablet Take 100 mcg by mouth daily before breakfast.  . LORazepam (ATIVAN) 1 MG tablet Take 1 mg by mouth at bedtime.  . montelukast (SINGULAIR) 10 MG tablet Take 10 mg by mouth at bedtime.  . OxyCODONE (OXYCONTIN) 20 mg T12A 12 hr tablet Take 20 mg by mouth 3 (three) times daily.   . Oxycodone HCl 10 MG TABS Take 10 mg by mouth 4 (four) times daily.  . pantoprazole (PROTONIX) 40 MG tablet Take 40 mg by mouth 2 (two) times daily.  . Vladimir Fasterlycol-Propyl Glycol (SYSTANE OP) Apply to eye every 4 (four) hours.  . ranitidine (ZANTAC) 150 MG capsule Take 300 mg by mouth every evening.  . sertraline (ZOLOFT) 100 MG tablet Take 100 mg by mouth. Take 1 1/2 tablets by mouth  . traZODone (DESYREL) 100 MG tablet Take 200 mg by mouth at bedtime.  . Marland Kitchenolpidem (AMBIEN) 10 MG tablet Take 10 mg by mouth at bedtime.   . fluticasone (FLONASE) 50 MCG/ACT nasal spray Place 2 sprays into both nostrils 2 (two) times daily.  . Immune Globulin, Human, (HIZENTRA [place]) Inject into the skin. ON HOLD for a few months per pt  . [DISCONTINUED] budesonide-formoterol (SYMBICORT) 160-4.5 MCG/ACT inhaler Inhale 2 puffs into the lungs 2 (two) times daily.

## 2014-11-07 NOTE — Assessment & Plan Note (Signed)
IgA deficiency with significant immune deficient state Plan Per immunology

## 2014-12-01 ENCOUNTER — Other Ambulatory Visit: Payer: Self-pay | Admitting: Otolaryngology

## 2014-12-01 DIAGNOSIS — R1313 Dysphagia, pharyngeal phase: Secondary | ICD-10-CM

## 2014-12-08 ENCOUNTER — Ambulatory Visit
Admission: RE | Admit: 2014-12-08 | Discharge: 2014-12-08 | Disposition: A | Discharge status: Home / Self Care | Payer: Commercial Managed Care - HMO | Source: Ambulatory Visit | Attending: Otolaryngology | Admitting: Otolaryngology

## 2014-12-08 DIAGNOSIS — R1313 Dysphagia, pharyngeal phase: Secondary | ICD-10-CM

## 2015-01-03 DIAGNOSIS — D839 Common variable immunodeficiency, unspecified: Secondary | ICD-10-CM | POA: Diagnosis not present

## 2015-01-11 DIAGNOSIS — G894 Chronic pain syndrome: Secondary | ICD-10-CM | POA: Diagnosis not present

## 2015-01-11 DIAGNOSIS — M150 Primary generalized (osteo)arthritis: Secondary | ICD-10-CM | POA: Diagnosis not present

## 2015-01-11 DIAGNOSIS — M545 Low back pain: Secondary | ICD-10-CM | POA: Diagnosis not present

## 2015-01-18 DIAGNOSIS — J449 Chronic obstructive pulmonary disease, unspecified: Secondary | ICD-10-CM | POA: Diagnosis not present

## 2015-01-18 DIAGNOSIS — M542 Cervicalgia: Secondary | ICD-10-CM | POA: Diagnosis not present

## 2015-02-05 DIAGNOSIS — D839 Common variable immunodeficiency, unspecified: Secondary | ICD-10-CM | POA: Diagnosis not present

## 2015-02-12 DIAGNOSIS — D839 Common variable immunodeficiency, unspecified: Secondary | ICD-10-CM | POA: Diagnosis not present

## 2015-02-16 DIAGNOSIS — F341 Dysthymic disorder: Secondary | ICD-10-CM | POA: Diagnosis not present

## 2015-02-16 DIAGNOSIS — E780 Pure hypercholesterolemia: Secondary | ICD-10-CM | POA: Diagnosis not present

## 2015-02-16 DIAGNOSIS — F5101 Primary insomnia: Secondary | ICD-10-CM | POA: Diagnosis not present

## 2015-02-16 DIAGNOSIS — J441 Chronic obstructive pulmonary disease with (acute) exacerbation: Secondary | ICD-10-CM | POA: Diagnosis not present

## 2015-02-16 DIAGNOSIS — D838 Other common variable immunodeficiencies: Secondary | ICD-10-CM | POA: Diagnosis not present

## 2015-02-16 DIAGNOSIS — M65321 Trigger finger, right index finger: Secondary | ICD-10-CM | POA: Diagnosis not present

## 2015-02-16 DIAGNOSIS — F3131 Bipolar disorder, current episode depressed, mild: Secondary | ICD-10-CM | POA: Diagnosis not present

## 2015-02-18 DIAGNOSIS — M542 Cervicalgia: Secondary | ICD-10-CM | POA: Diagnosis not present

## 2015-02-18 DIAGNOSIS — J449 Chronic obstructive pulmonary disease, unspecified: Secondary | ICD-10-CM | POA: Diagnosis not present

## 2015-02-20 DIAGNOSIS — D839 Common variable immunodeficiency, unspecified: Secondary | ICD-10-CM | POA: Diagnosis not present

## 2015-02-21 DIAGNOSIS — R7301 Impaired fasting glucose: Secondary | ICD-10-CM | POA: Diagnosis not present

## 2015-02-21 DIAGNOSIS — E034 Atrophy of thyroid (acquired): Secondary | ICD-10-CM | POA: Diagnosis not present

## 2015-02-21 DIAGNOSIS — E780 Pure hypercholesterolemia: Secondary | ICD-10-CM | POA: Diagnosis not present

## 2015-03-01 DIAGNOSIS — D803 Selective deficiency of immunoglobulin G [IgG] subclasses: Secondary | ICD-10-CM | POA: Diagnosis not present

## 2015-03-01 DIAGNOSIS — J4540 Moderate persistent asthma, uncomplicated: Secondary | ICD-10-CM | POA: Diagnosis not present

## 2015-03-06 DIAGNOSIS — D839 Common variable immunodeficiency, unspecified: Secondary | ICD-10-CM | POA: Diagnosis not present

## 2015-03-08 DIAGNOSIS — M545 Low back pain: Secondary | ICD-10-CM | POA: Diagnosis not present

## 2015-03-08 DIAGNOSIS — M259 Joint disorder, unspecified: Secondary | ICD-10-CM | POA: Diagnosis not present

## 2015-03-08 DIAGNOSIS — G894 Chronic pain syndrome: Secondary | ICD-10-CM | POA: Diagnosis not present

## 2015-03-19 DIAGNOSIS — M542 Cervicalgia: Secondary | ICD-10-CM | POA: Diagnosis not present

## 2015-03-22 DIAGNOSIS — M542 Cervicalgia: Secondary | ICD-10-CM | POA: Diagnosis not present

## 2015-03-22 DIAGNOSIS — M9901 Segmental and somatic dysfunction of cervical region: Secondary | ICD-10-CM | POA: Diagnosis not present

## 2015-03-22 DIAGNOSIS — M545 Low back pain: Secondary | ICD-10-CM | POA: Diagnosis not present

## 2015-03-22 DIAGNOSIS — M546 Pain in thoracic spine: Secondary | ICD-10-CM | POA: Diagnosis not present

## 2015-03-22 DIAGNOSIS — M9903 Segmental and somatic dysfunction of lumbar region: Secondary | ICD-10-CM | POA: Diagnosis not present

## 2015-03-22 DIAGNOSIS — M9902 Segmental and somatic dysfunction of thoracic region: Secondary | ICD-10-CM | POA: Diagnosis not present

## 2015-04-03 DIAGNOSIS — D839 Common variable immunodeficiency, unspecified: Secondary | ICD-10-CM | POA: Diagnosis not present

## 2015-04-12 DIAGNOSIS — K219 Gastro-esophageal reflux disease without esophagitis: Secondary | ICD-10-CM | POA: Diagnosis not present

## 2015-04-12 DIAGNOSIS — J188 Other pneumonia, unspecified organism: Secondary | ICD-10-CM | POA: Diagnosis not present

## 2015-04-12 DIAGNOSIS — R202 Paresthesia of skin: Secondary | ICD-10-CM | POA: Diagnosis not present

## 2015-04-12 DIAGNOSIS — J208 Acute bronchitis due to other specified organisms: Secondary | ICD-10-CM | POA: Diagnosis not present

## 2015-04-19 DIAGNOSIS — M542 Cervicalgia: Secondary | ICD-10-CM | POA: Diagnosis not present

## 2015-04-19 DIAGNOSIS — J449 Chronic obstructive pulmonary disease, unspecified: Secondary | ICD-10-CM | POA: Diagnosis not present

## 2015-04-24 DIAGNOSIS — E538 Deficiency of other specified B group vitamins: Secondary | ICD-10-CM | POA: Diagnosis not present

## 2015-04-30 DIAGNOSIS — D839 Common variable immunodeficiency, unspecified: Secondary | ICD-10-CM | POA: Diagnosis not present

## 2015-05-01 DIAGNOSIS — E538 Deficiency of other specified B group vitamins: Secondary | ICD-10-CM | POA: Diagnosis not present

## 2015-05-08 DIAGNOSIS — D518 Other vitamin B12 deficiency anemias: Secondary | ICD-10-CM | POA: Diagnosis not present

## 2015-05-09 DIAGNOSIS — M150 Primary generalized (osteo)arthritis: Secondary | ICD-10-CM | POA: Diagnosis not present

## 2015-05-09 DIAGNOSIS — M5440 Lumbago with sciatica, unspecified side: Secondary | ICD-10-CM | POA: Diagnosis not present

## 2015-05-09 DIAGNOSIS — G894 Chronic pain syndrome: Secondary | ICD-10-CM | POA: Diagnosis not present

## 2015-05-10 DIAGNOSIS — M9903 Segmental and somatic dysfunction of lumbar region: Secondary | ICD-10-CM | POA: Diagnosis not present

## 2015-05-10 DIAGNOSIS — M5414 Radiculopathy, thoracic region: Secondary | ICD-10-CM | POA: Diagnosis not present

## 2015-05-10 DIAGNOSIS — M542 Cervicalgia: Secondary | ICD-10-CM | POA: Diagnosis not present

## 2015-05-10 DIAGNOSIS — M9901 Segmental and somatic dysfunction of cervical region: Secondary | ICD-10-CM | POA: Diagnosis not present

## 2015-05-10 DIAGNOSIS — M62830 Muscle spasm of back: Secondary | ICD-10-CM | POA: Diagnosis not present

## 2015-05-10 DIAGNOSIS — M9902 Segmental and somatic dysfunction of thoracic region: Secondary | ICD-10-CM | POA: Diagnosis not present

## 2015-05-10 DIAGNOSIS — M545 Low back pain: Secondary | ICD-10-CM | POA: Diagnosis not present

## 2015-05-16 DIAGNOSIS — E538 Deficiency of other specified B group vitamins: Secondary | ICD-10-CM | POA: Diagnosis not present

## 2015-05-19 DIAGNOSIS — M542 Cervicalgia: Secondary | ICD-10-CM | POA: Diagnosis not present

## 2015-05-19 DIAGNOSIS — J449 Chronic obstructive pulmonary disease, unspecified: Secondary | ICD-10-CM | POA: Diagnosis not present

## 2015-05-24 DIAGNOSIS — E780 Pure hypercholesterolemia: Secondary | ICD-10-CM | POA: Diagnosis not present

## 2015-05-24 DIAGNOSIS — E538 Deficiency of other specified B group vitamins: Secondary | ICD-10-CM | POA: Diagnosis not present

## 2015-05-24 DIAGNOSIS — E782 Mixed hyperlipidemia: Secondary | ICD-10-CM | POA: Diagnosis not present

## 2015-05-24 DIAGNOSIS — K219 Gastro-esophageal reflux disease without esophagitis: Secondary | ICD-10-CM | POA: Diagnosis not present

## 2015-05-24 DIAGNOSIS — F3131 Bipolar disorder, current episode depressed, mild: Secondary | ICD-10-CM | POA: Diagnosis not present

## 2015-05-24 DIAGNOSIS — R0789 Other chest pain: Secondary | ICD-10-CM | POA: Diagnosis not present

## 2015-05-24 DIAGNOSIS — R7301 Impaired fasting glucose: Secondary | ICD-10-CM | POA: Diagnosis not present

## 2015-05-24 DIAGNOSIS — F5101 Primary insomnia: Secondary | ICD-10-CM | POA: Diagnosis not present

## 2015-05-24 DIAGNOSIS — D838 Other common variable immunodeficiencies: Secondary | ICD-10-CM | POA: Diagnosis not present

## 2015-05-28 DIAGNOSIS — D839 Common variable immunodeficiency, unspecified: Secondary | ICD-10-CM | POA: Diagnosis not present

## 2015-06-01 DIAGNOSIS — R1011 Right upper quadrant pain: Secondary | ICD-10-CM | POA: Diagnosis not present

## 2015-06-01 DIAGNOSIS — R10811 Right upper quadrant abdominal tenderness: Secondary | ICD-10-CM | POA: Diagnosis not present

## 2015-06-06 DIAGNOSIS — M150 Primary generalized (osteo)arthritis: Secondary | ICD-10-CM | POA: Diagnosis not present

## 2015-06-06 DIAGNOSIS — M5440 Lumbago with sciatica, unspecified side: Secondary | ICD-10-CM | POA: Diagnosis not present

## 2015-06-06 DIAGNOSIS — G894 Chronic pain syndrome: Secondary | ICD-10-CM | POA: Diagnosis not present

## 2015-06-14 DIAGNOSIS — J019 Acute sinusitis, unspecified: Secondary | ICD-10-CM | POA: Diagnosis not present

## 2015-06-19 DIAGNOSIS — M542 Cervicalgia: Secondary | ICD-10-CM | POA: Diagnosis not present

## 2015-06-19 DIAGNOSIS — J449 Chronic obstructive pulmonary disease, unspecified: Secondary | ICD-10-CM | POA: Diagnosis not present

## 2015-06-25 DIAGNOSIS — D839 Common variable immunodeficiency, unspecified: Secondary | ICD-10-CM | POA: Diagnosis not present

## 2015-06-26 DIAGNOSIS — J449 Chronic obstructive pulmonary disease, unspecified: Secondary | ICD-10-CM | POA: Diagnosis not present

## 2015-06-26 DIAGNOSIS — E039 Hypothyroidism, unspecified: Secondary | ICD-10-CM | POA: Diagnosis not present

## 2015-06-26 DIAGNOSIS — K2950 Unspecified chronic gastritis without bleeding: Secondary | ICD-10-CM | POA: Diagnosis not present

## 2015-06-26 DIAGNOSIS — K2970 Gastritis, unspecified, without bleeding: Secondary | ICD-10-CM | POA: Diagnosis not present

## 2015-06-26 DIAGNOSIS — Z79899 Other long term (current) drug therapy: Secondary | ICD-10-CM | POA: Diagnosis not present

## 2015-06-26 DIAGNOSIS — G894 Chronic pain syndrome: Secondary | ICD-10-CM | POA: Diagnosis not present

## 2015-06-26 DIAGNOSIS — R0789 Other chest pain: Secondary | ICD-10-CM | POA: Diagnosis not present

## 2015-06-26 DIAGNOSIS — R918 Other nonspecific abnormal finding of lung field: Secondary | ICD-10-CM | POA: Diagnosis not present

## 2015-06-26 DIAGNOSIS — D839 Common variable immunodeficiency, unspecified: Secondary | ICD-10-CM | POA: Diagnosis not present

## 2015-06-26 DIAGNOSIS — R072 Precordial pain: Secondary | ICD-10-CM | POA: Diagnosis not present

## 2015-06-26 DIAGNOSIS — R079 Chest pain, unspecified: Secondary | ICD-10-CM | POA: Diagnosis not present

## 2015-06-26 DIAGNOSIS — K219 Gastro-esophageal reflux disease without esophagitis: Secondary | ICD-10-CM | POA: Diagnosis not present

## 2015-06-26 DIAGNOSIS — I200 Unstable angina: Secondary | ICD-10-CM | POA: Diagnosis not present

## 2015-06-26 DIAGNOSIS — E781 Pure hyperglyceridemia: Secondary | ICD-10-CM | POA: Diagnosis not present

## 2015-06-26 DIAGNOSIS — K760 Fatty (change of) liver, not elsewhere classified: Secondary | ICD-10-CM | POA: Diagnosis not present

## 2015-06-27 DIAGNOSIS — K2970 Gastritis, unspecified, without bleeding: Secondary | ICD-10-CM | POA: Diagnosis not present

## 2015-06-27 DIAGNOSIS — R072 Precordial pain: Secondary | ICD-10-CM | POA: Diagnosis not present

## 2015-06-27 DIAGNOSIS — K219 Gastro-esophageal reflux disease without esophagitis: Secondary | ICD-10-CM | POA: Diagnosis not present

## 2015-06-27 DIAGNOSIS — E039 Hypothyroidism, unspecified: Secondary | ICD-10-CM | POA: Diagnosis not present

## 2015-06-27 DIAGNOSIS — G894 Chronic pain syndrome: Secondary | ICD-10-CM | POA: Diagnosis not present

## 2015-06-27 DIAGNOSIS — J449 Chronic obstructive pulmonary disease, unspecified: Secondary | ICD-10-CM | POA: Diagnosis not present

## 2015-06-27 DIAGNOSIS — E781 Pure hyperglyceridemia: Secondary | ICD-10-CM | POA: Diagnosis not present

## 2015-06-27 DIAGNOSIS — K2950 Unspecified chronic gastritis without bleeding: Secondary | ICD-10-CM | POA: Diagnosis not present

## 2015-06-27 DIAGNOSIS — R079 Chest pain, unspecified: Secondary | ICD-10-CM | POA: Diagnosis not present

## 2015-06-27 DIAGNOSIS — Z79899 Other long term (current) drug therapy: Secondary | ICD-10-CM | POA: Diagnosis not present

## 2015-06-27 DIAGNOSIS — K760 Fatty (change of) liver, not elsewhere classified: Secondary | ICD-10-CM | POA: Diagnosis not present

## 2015-06-27 DIAGNOSIS — D839 Common variable immunodeficiency, unspecified: Secondary | ICD-10-CM | POA: Diagnosis not present

## 2015-06-28 DIAGNOSIS — E039 Hypothyroidism, unspecified: Secondary | ICD-10-CM | POA: Diagnosis not present

## 2015-06-28 DIAGNOSIS — E781 Pure hyperglyceridemia: Secondary | ICD-10-CM | POA: Diagnosis not present

## 2015-06-28 DIAGNOSIS — K219 Gastro-esophageal reflux disease without esophagitis: Secondary | ICD-10-CM | POA: Diagnosis not present

## 2015-06-28 DIAGNOSIS — K2970 Gastritis, unspecified, without bleeding: Secondary | ICD-10-CM | POA: Diagnosis not present

## 2015-06-28 DIAGNOSIS — Z79899 Other long term (current) drug therapy: Secondary | ICD-10-CM | POA: Diagnosis not present

## 2015-06-28 DIAGNOSIS — J449 Chronic obstructive pulmonary disease, unspecified: Secondary | ICD-10-CM | POA: Diagnosis not present

## 2015-06-28 DIAGNOSIS — G894 Chronic pain syndrome: Secondary | ICD-10-CM | POA: Diagnosis not present

## 2015-06-28 DIAGNOSIS — K760 Fatty (change of) liver, not elsewhere classified: Secondary | ICD-10-CM | POA: Diagnosis not present

## 2015-06-28 DIAGNOSIS — R072 Precordial pain: Secondary | ICD-10-CM | POA: Diagnosis not present

## 2015-07-04 DIAGNOSIS — M150 Primary generalized (osteo)arthritis: Secondary | ICD-10-CM | POA: Diagnosis not present

## 2015-07-04 DIAGNOSIS — K760 Fatty (change of) liver, not elsewhere classified: Secondary | ICD-10-CM | POA: Diagnosis not present

## 2015-07-04 DIAGNOSIS — M545 Low back pain: Secondary | ICD-10-CM | POA: Diagnosis not present

## 2015-07-04 DIAGNOSIS — R799 Abnormal finding of blood chemistry, unspecified: Secondary | ICD-10-CM | POA: Diagnosis not present

## 2015-07-04 DIAGNOSIS — R1084 Generalized abdominal pain: Secondary | ICD-10-CM | POA: Diagnosis not present

## 2015-07-04 DIAGNOSIS — G894 Chronic pain syndrome: Secondary | ICD-10-CM | POA: Diagnosis not present

## 2015-07-04 DIAGNOSIS — R5383 Other fatigue: Secondary | ICD-10-CM | POA: Diagnosis not present

## 2015-07-13 DIAGNOSIS — E538 Deficiency of other specified B group vitamins: Secondary | ICD-10-CM | POA: Diagnosis not present

## 2015-07-13 DIAGNOSIS — D649 Anemia, unspecified: Secondary | ICD-10-CM | POA: Diagnosis not present

## 2015-07-17 DIAGNOSIS — Z1211 Encounter for screening for malignant neoplasm of colon: Secondary | ICD-10-CM | POA: Diagnosis not present

## 2015-07-19 DIAGNOSIS — J449 Chronic obstructive pulmonary disease, unspecified: Secondary | ICD-10-CM | POA: Diagnosis not present

## 2015-07-19 DIAGNOSIS — J0100 Acute maxillary sinusitis, unspecified: Secondary | ICD-10-CM | POA: Diagnosis not present

## 2015-07-19 DIAGNOSIS — M542 Cervicalgia: Secondary | ICD-10-CM | POA: Diagnosis not present

## 2015-07-20 DIAGNOSIS — M9901 Segmental and somatic dysfunction of cervical region: Secondary | ICD-10-CM | POA: Diagnosis not present

## 2015-07-20 DIAGNOSIS — M5412 Radiculopathy, cervical region: Secondary | ICD-10-CM | POA: Diagnosis not present

## 2015-07-20 DIAGNOSIS — M9900 Segmental and somatic dysfunction of head region: Secondary | ICD-10-CM | POA: Diagnosis not present

## 2015-07-20 DIAGNOSIS — M9902 Segmental and somatic dysfunction of thoracic region: Secondary | ICD-10-CM | POA: Diagnosis not present

## 2015-07-20 DIAGNOSIS — M5481 Occipital neuralgia: Secondary | ICD-10-CM | POA: Diagnosis not present

## 2015-07-20 DIAGNOSIS — M62830 Muscle spasm of back: Secondary | ICD-10-CM | POA: Diagnosis not present

## 2015-07-23 DIAGNOSIS — D839 Common variable immunodeficiency, unspecified: Secondary | ICD-10-CM | POA: Diagnosis not present

## 2015-08-09 DIAGNOSIS — G894 Chronic pain syndrome: Secondary | ICD-10-CM | POA: Diagnosis not present

## 2015-08-09 DIAGNOSIS — M545 Low back pain: Secondary | ICD-10-CM | POA: Diagnosis not present

## 2015-08-09 DIAGNOSIS — M150 Primary generalized (osteo)arthritis: Secondary | ICD-10-CM | POA: Diagnosis not present

## 2015-08-10 DIAGNOSIS — R0789 Other chest pain: Secondary | ICD-10-CM | POA: Diagnosis not present

## 2015-08-10 DIAGNOSIS — K210 Gastro-esophageal reflux disease with esophagitis: Secondary | ICD-10-CM | POA: Diagnosis not present

## 2015-08-19 DIAGNOSIS — M542 Cervicalgia: Secondary | ICD-10-CM | POA: Diagnosis not present

## 2015-08-19 DIAGNOSIS — J449 Chronic obstructive pulmonary disease, unspecified: Secondary | ICD-10-CM | POA: Diagnosis not present

## 2015-08-20 DIAGNOSIS — D839 Common variable immunodeficiency, unspecified: Secondary | ICD-10-CM | POA: Diagnosis not present

## 2015-08-23 ENCOUNTER — Other Ambulatory Visit: Payer: Self-pay

## 2015-08-23 DIAGNOSIS — K259 Gastric ulcer, unspecified as acute or chronic, without hemorrhage or perforation: Secondary | ICD-10-CM | POA: Diagnosis not present

## 2015-08-23 DIAGNOSIS — G4730 Sleep apnea, unspecified: Secondary | ICD-10-CM | POA: Diagnosis not present

## 2015-08-23 DIAGNOSIS — K228 Other specified diseases of esophagus: Secondary | ICD-10-CM | POA: Diagnosis not present

## 2015-08-23 DIAGNOSIS — E780 Pure hypercholesterolemia: Secondary | ICD-10-CM | POA: Diagnosis not present

## 2015-08-23 DIAGNOSIS — J069 Acute upper respiratory infection, unspecified: Secondary | ICD-10-CM | POA: Diagnosis not present

## 2015-08-23 DIAGNOSIS — K210 Gastro-esophageal reflux disease with esophagitis: Secondary | ICD-10-CM | POA: Diagnosis not present

## 2015-08-23 DIAGNOSIS — K117 Disturbances of salivary secretion: Secondary | ICD-10-CM | POA: Diagnosis not present

## 2015-08-23 DIAGNOSIS — K3189 Other diseases of stomach and duodenum: Secondary | ICD-10-CM | POA: Diagnosis not present

## 2015-08-23 DIAGNOSIS — B3781 Candidal esophagitis: Secondary | ICD-10-CM | POA: Diagnosis not present

## 2015-08-23 DIAGNOSIS — K2950 Unspecified chronic gastritis without bleeding: Secondary | ICD-10-CM | POA: Diagnosis not present

## 2015-08-23 DIAGNOSIS — K319 Disease of stomach and duodenum, unspecified: Secondary | ICD-10-CM | POA: Diagnosis not present

## 2015-08-23 DIAGNOSIS — K253 Acute gastric ulcer without hemorrhage or perforation: Secondary | ICD-10-CM | POA: Diagnosis not present

## 2015-08-23 DIAGNOSIS — R0789 Other chest pain: Secondary | ICD-10-CM | POA: Diagnosis not present

## 2015-08-23 DIAGNOSIS — G4733 Obstructive sleep apnea (adult) (pediatric): Secondary | ICD-10-CM | POA: Diagnosis not present

## 2015-08-23 DIAGNOSIS — G8929 Other chronic pain: Secondary | ICD-10-CM | POA: Diagnosis not present

## 2015-08-29 DIAGNOSIS — R51 Headache: Secondary | ICD-10-CM | POA: Diagnosis not present

## 2015-08-29 DIAGNOSIS — E780 Pure hypercholesterolemia: Secondary | ICD-10-CM | POA: Diagnosis not present

## 2015-08-29 DIAGNOSIS — K219 Gastro-esophageal reflux disease without esophagitis: Secondary | ICD-10-CM | POA: Diagnosis not present

## 2015-08-29 DIAGNOSIS — Z23 Encounter for immunization: Secondary | ICD-10-CM | POA: Diagnosis not present

## 2015-08-29 DIAGNOSIS — E782 Mixed hyperlipidemia: Secondary | ICD-10-CM | POA: Diagnosis not present

## 2015-08-29 DIAGNOSIS — R7301 Impaired fasting glucose: Secondary | ICD-10-CM | POA: Diagnosis not present

## 2015-08-29 DIAGNOSIS — D838 Other common variable immunodeficiencies: Secondary | ICD-10-CM | POA: Diagnosis not present

## 2015-08-29 DIAGNOSIS — M542 Cervicalgia: Secondary | ICD-10-CM | POA: Diagnosis not present

## 2015-08-29 DIAGNOSIS — E034 Atrophy of thyroid (acquired): Secondary | ICD-10-CM | POA: Diagnosis not present

## 2015-08-29 DIAGNOSIS — R0789 Other chest pain: Secondary | ICD-10-CM | POA: Diagnosis not present

## 2015-08-31 DIAGNOSIS — R51 Headache: Secondary | ICD-10-CM | POA: Diagnosis not present

## 2015-09-10 DIAGNOSIS — E291 Testicular hypofunction: Secondary | ICD-10-CM

## 2015-09-10 DIAGNOSIS — B370 Candidal stomatitis: Secondary | ICD-10-CM | POA: Insufficient documentation

## 2015-09-10 DIAGNOSIS — J449 Chronic obstructive pulmonary disease, unspecified: Secondary | ICD-10-CM | POA: Insufficient documentation

## 2015-09-10 DIAGNOSIS — G4730 Sleep apnea, unspecified: Secondary | ICD-10-CM | POA: Insufficient documentation

## 2015-09-17 DIAGNOSIS — D839 Common variable immunodeficiency, unspecified: Secondary | ICD-10-CM | POA: Diagnosis not present

## 2015-09-19 DIAGNOSIS — J449 Chronic obstructive pulmonary disease, unspecified: Secondary | ICD-10-CM | POA: Diagnosis not present

## 2015-09-19 DIAGNOSIS — M542 Cervicalgia: Secondary | ICD-10-CM | POA: Diagnosis not present

## 2015-09-25 DIAGNOSIS — M542 Cervicalgia: Secondary | ICD-10-CM | POA: Diagnosis not present

## 2015-09-25 DIAGNOSIS — M545 Low back pain: Secondary | ICD-10-CM | POA: Diagnosis not present

## 2015-09-25 DIAGNOSIS — M62830 Muscle spasm of back: Secondary | ICD-10-CM | POA: Diagnosis not present

## 2015-09-25 DIAGNOSIS — M9903 Segmental and somatic dysfunction of lumbar region: Secondary | ICD-10-CM | POA: Diagnosis not present

## 2015-09-25 DIAGNOSIS — M9901 Segmental and somatic dysfunction of cervical region: Secondary | ICD-10-CM | POA: Diagnosis not present

## 2015-09-25 DIAGNOSIS — M9902 Segmental and somatic dysfunction of thoracic region: Secondary | ICD-10-CM | POA: Diagnosis not present

## 2015-10-01 DIAGNOSIS — D6489 Other specified anemias: Secondary | ICD-10-CM | POA: Diagnosis not present

## 2015-10-01 DIAGNOSIS — E034 Atrophy of thyroid (acquired): Secondary | ICD-10-CM | POA: Diagnosis not present

## 2015-10-01 DIAGNOSIS — R5383 Other fatigue: Secondary | ICD-10-CM | POA: Diagnosis not present

## 2015-10-02 DIAGNOSIS — M62830 Muscle spasm of back: Secondary | ICD-10-CM | POA: Diagnosis not present

## 2015-10-02 DIAGNOSIS — M9901 Segmental and somatic dysfunction of cervical region: Secondary | ICD-10-CM | POA: Diagnosis not present

## 2015-10-02 DIAGNOSIS — M542 Cervicalgia: Secondary | ICD-10-CM | POA: Diagnosis not present

## 2015-10-02 DIAGNOSIS — M545 Low back pain: Secondary | ICD-10-CM | POA: Diagnosis not present

## 2015-10-02 DIAGNOSIS — M9903 Segmental and somatic dysfunction of lumbar region: Secondary | ICD-10-CM | POA: Diagnosis not present

## 2015-10-02 DIAGNOSIS — M9902 Segmental and somatic dysfunction of thoracic region: Secondary | ICD-10-CM | POA: Diagnosis not present

## 2015-10-03 ENCOUNTER — Other Ambulatory Visit: Payer: Self-pay | Admitting: Allergy and Immunology

## 2015-10-09 DIAGNOSIS — M150 Primary generalized (osteo)arthritis: Secondary | ICD-10-CM | POA: Diagnosis not present

## 2015-10-09 DIAGNOSIS — G894 Chronic pain syndrome: Secondary | ICD-10-CM | POA: Diagnosis not present

## 2015-10-09 DIAGNOSIS — M5440 Lumbago with sciatica, unspecified side: Secondary | ICD-10-CM | POA: Diagnosis not present

## 2015-10-15 DIAGNOSIS — D839 Common variable immunodeficiency, unspecified: Secondary | ICD-10-CM | POA: Diagnosis not present

## 2015-10-17 DIAGNOSIS — J208 Acute bronchitis due to other specified organisms: Secondary | ICD-10-CM | POA: Diagnosis not present

## 2015-10-17 DIAGNOSIS — D838 Other common variable immunodeficiencies: Secondary | ICD-10-CM | POA: Diagnosis not present

## 2015-10-19 DIAGNOSIS — M542 Cervicalgia: Secondary | ICD-10-CM | POA: Diagnosis not present

## 2015-10-19 DIAGNOSIS — J449 Chronic obstructive pulmonary disease, unspecified: Secondary | ICD-10-CM | POA: Diagnosis not present

## 2015-10-22 DIAGNOSIS — R799 Abnormal finding of blood chemistry, unspecified: Secondary | ICD-10-CM | POA: Diagnosis not present

## 2015-10-25 ENCOUNTER — Ambulatory Visit (INDEPENDENT_AMBULATORY_CARE_PROVIDER_SITE_OTHER): Payer: Commercial Managed Care - HMO | Admitting: Allergy and Immunology

## 2015-10-25 ENCOUNTER — Encounter: Payer: Self-pay | Admitting: Allergy and Immunology

## 2015-10-25 VITALS — BP 114/60 | HR 88 | Resp 20 | Ht 66.0 in | Wt 202.0 lb

## 2015-10-25 DIAGNOSIS — G4733 Obstructive sleep apnea (adult) (pediatric): Secondary | ICD-10-CM

## 2015-10-25 DIAGNOSIS — D839 Common variable immunodeficiency, unspecified: Secondary | ICD-10-CM

## 2015-10-25 DIAGNOSIS — G4734 Idiopathic sleep related nonobstructive alveolar hypoventilation: Secondary | ICD-10-CM

## 2015-10-25 DIAGNOSIS — J479 Bronchiectasis, uncomplicated: Secondary | ICD-10-CM | POA: Diagnosis not present

## 2015-10-25 DIAGNOSIS — J4550 Severe persistent asthma, uncomplicated: Secondary | ICD-10-CM

## 2015-10-25 NOTE — Progress Notes (Signed)
[facility] [place]  Follow-up Note  Refering Provider: Rochel Brome, MD Primary Provider: Rochel Brome, MD  Subjective:   Isaac Walsh is a 49 y.o. male who returns to the [facility] in re-evaluation of the following:  HPI Comments: Isaac Walsh returns to this clinic in reevaluation of his CVID complicated by chronic Pseudomonas pulmonary infection, bronchiectasis, obstructive lung disease with component of asthma and lymphopenia. Overall he has done relatively well over the course of the past 6 months. He required steroids and antibiotics for respiratory tract infection 4 months ago and 2 weeks ago he got a cough for which he was given an antibiotic. Both of these treatment plans were successful in alleviating his respiratory tract symptoms. For the most part he has not been having much wheezing and coughing and he rarely uses a nebulizer. His nose is been doing relatively well. He does not use any nasal steroid at this point. He will occasionally use an over-the-counter antihistamine and decongestant. His reflux is now being treated with omeprazole 40 mg in the morning and ranitidine 300 mg in the evening. He had upper Isaac with Dr. Lyndel Walsh which apparently documented rather significant reflux throughout his entire esophagus. He is scheduled for another repeat Isaac next month. He may been treated with Diflucan at the same time that he have his antireflux medication manipulated. He was admitted to the hospital some time a few months ago for a chest pain evaluation which was negative. He does complain about having some left ear dullness intermittently associated with tendinitis. He's had tubes in that year on 2 occasions in the past. He does not want to see an ear nose and throat physician at this point in time as he can clear his ear with positive pressure. He's had headache evaluation with a CT scan of Isaac Walsh  which apparently was normal. He has complaints of chronic bone and muscle and neck and sciatic pain which is been a long-standing issue for which she visits the pain clinic.    Outpatient Encounter Prescriptions as of 10/25/2015  Medication Sig  . acyclovir (ZOVIRAX) 400 MG tablet Take 400 mg by mouth 5 (five) times daily. As needed  . budesonide-formoterol (SYMBICORT) 160-4.5 MCG/ACT inhaler Inhale 2 puffs into the lungs 2 (two) times daily. With spacer  . carisoprodol (SOMA) 350 MG tablet Take 350 mg by mouth every 6 (six) hours as needed for muscle spasms.   . Fe Fum-FA-B Cmp-C-Zn-Mg-Mn-Cu (HEMATINIC PLUS COMPLEX) 106-1 MG TABS Take 1 capsule by mouth daily.  . fenofibrate micronized (LOFIBRA) 134 MG capsule Take 134 mg by mouth daily.  . finasteride (PROPECIA) 1 MG tablet Take 1 mg by mouth daily.  . Immune Globulin 10% 5 GM/50ML SOLN Inject 50 g into the vein every 30 (thirty) days.  . Immune Globulin, Human, (HIZENTRA Isaac Walsh) Inject into the skin. ON HOLD for a few months per pt  . ipratropium-albuterol (DUONEB) 0.5-2.5 (3) MG/3ML SOLN Take 3 mLs by nebulization every 6 (six) hours as needed.  Marland Kitchen levothyroxine (SYNTHROID, LEVOTHROID) 100 MCG tablet Take 100 mcg by mouth daily before breakfast.  . LORazepam (ATIVAN) 1 MG tablet Take 1 mg by mouth at bedtime.  . montelukast (SINGULAIR) 10 MG tablet Take 10 mg by mouth at bedtime.  Marland Kitchen omeprazole (PRILOSEC) 40 MG capsule Take 40 mg by mouth daily.  . OxyCODONE (OXYCONTIN) 20 mg T12A 12 hr tablet Take 20 mg by mouth 3 (three) times daily.   Marland Kitchen  Polyethyl Glycol-Propyl Glycol (SYSTANE OP) Apply to eye every 4 (four) hours.  . ranitidine (ZANTAC) 150 MG capsule Take 300 mg by mouth every evening.  . ranitidine (ZANTAC) 300 MG tablet TAKE ONE TABLET BY MOUTH AT BEDTIME. NEEDS APPOINTMENT  . sertraline (ZOLOFT) 100 MG tablet Take 100 mg by mouth. Take 1 1/2 tablets by mouth  . traZODone (DESYREL) 100 MG tablet Take 200 mg by mouth at bedtime.  Marland Kitchen  zolpidem (AMBIEN) 10 MG tablet Take 10 mg by mouth at bedtime.   . diclofenac (VOLTAREN) 75 MG EC tablet Take 75 mg by mouth 2 (two) times daily.  . fluticasone (FLONASE) 50 MCG/ACT nasal spray Place 2 sprays into both nostrils 2 (two) times daily. (Patient not taking: Reported on 10/25/2015)  . Fluticasone Furoate-Vilanterol (BREO ELLIPTA IN) Inhale into the lungs. Take 1 puff every day  . nitroGLYCERIN (NITROSTAT) 0.4 MG SL tablet Place 0.4 mg under the tongue as needed.  . Oxycodone HCl 10 MG TABS Take 10 mg by mouth 4 (four) times daily.  . pantoprazole (PROTONIX) 40 MG tablet Take 40 mg by mouth 2 (two) times daily.   No facility-administered encounter medications on file as of 10/25/2015.    No orders of the defined types were placed in this encounter.    Past Medical History  Diagnosis Date  . Abnormal heart rhythm   . Hypothyroidism   . Trigger finger   . COPD (chronic obstructive pulmonary disease) ([place])   . Chronic pain syndrome   . Common variable immunodeficiency ([place])   . 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 Depression with anxiety   . Fatigue   . GERD (gastroesophageal reflux disease)   . Hypercholesterolemia   . Nonalcoholic fatty liver disease   . OSA (obstructive sleep apnea)   . Osgood-Schlatter's disease   . Other nonspecific abnormal finding of lung field   . SCID (severe combined immunodeficiency disease) ([place])   . Unspecified deficiency anemia   . Xerostomia   . Renal stones   . Plantar fasciitis   . Sciatica   . Lymphoma ([place])     as a child  . Asthma     Past Surgical History  Procedure Laterality Date  . Sinusectomy    . Liver surgery      10% of liver removed  . Lymphadenectomy    . Tympanostomy tube placement      Allergies  Allergen Reactions  . Androgel Pump [Testosterone]     rash  . Avelox [Moxifloxacin]     Blisters and "doesn't work for me" per pt  . Ceclor [Cefaclor]   . Celestone [Betamethasone]   . Cephalosporins  Hives  . Claritin [Loratadine]   . Corticosteroids Hives  . Effexor [Venlafaxine]   . Flumist [Flu Virus Vaccine]     Pt reports he can tolerate flu shot but not mist  . Naprosyn [Naproxen]   . Quinolones Hives  . Tizanidine   . Toradol [Ketorolac Tromethamine]   . Vicodin [Hydrocodone-Acetaminophen]   . Rocephin [Ceftriaxone] Hives and Rash    Review of Systems  Constitutional: Positive for fatigue. Negative for fever, chills and appetite change.  HENT: Positive for tinnitus. Negative for congestion, ear discharge, ear pain, facial swelling, hearing loss, mouth sores, nosebleeds, postnasal drip, sinus pressure, sneezing, sore throat, trouble swallowing and voice change.   Eyes: Negative for pain, discharge, redness and itching.  Respiratory: Positive for cough and wheezing. Negative for choking, chest tightness, shortness of breath and stridor.  Cardiovascular: Negative for chest pain, palpitations and leg swelling.  Gastrointestinal: Negative for nausea, vomiting, abdominal pain and abdominal distention.  Musculoskeletal: Positive for myalgias, back pain, arthralgias and neck pain.  Skin: Negative for rash.  Hematological: Negative for adenopathy.     Objective:   Filed Vitals:   10/25/15 1524  BP: 114/60  Pulse: 88  Resp: 20   Height: _0  (167.6 cm)  Weight: 202 lb (91.627 kg)   Physical Exam  Constitutional: He appears well-developed and well-nourished. No distress.  HENT:  Right Ear: External ear normal. Tympanic membrane is not scarred.  Left Ear: External ear normal. Tympanic membrane is not scarred.  Nose: No mucosal edema, rhinorrhea or nose lacerations.  Mouth/Throat: No oropharyngeal exudate or posterior oropharyngeal erythema.  Septal perforation  Eyes: Conjunctivae are normal. Right eye exhibits no discharge. Left eye exhibits no discharge. No scleral icterus.  Neck: No tracheal deviation present. No thyromegaly present.  Cardiovascular: Normal rate,  regular rhythm and normal heart sounds.  Exam reveals no gallop and no friction rub.   No murmur heard. Pulmonary/Chest: No stridor. No respiratory distress. He has no wheezes. He exhibits no tenderness.  Lymphadenopathy:    He has no cervical adenopathy.  Skin: He is not diaphoretic.    Diagnostics:    Spirometry was performed and demonstrated an FEV1 of 1.9 to at 63 % of predicted.  The patient had an Asthma Control Test with the following results: ACT Total Score: 12.    Assessment and Plan:   1. Asthma, severe persistent, uncomplicated   2. Bronchiectasis without complication ([place])   3. CVID (common variable immunodeficiency) ([place])   4. Chronic intermittent hypoxia with obstructive sleep apnea      1. Continue Privagen infusions   2. Continue Symbicort 160 two inhalations two times per day. Decrease Qvar 80 to one puff twice a day for two weeks then try to discontinue Qvar  3. Continue Montelukast 26m one tablet one time per day   4. Continue Omeprazole 40 in AM and Ranitidine 300 in PM  5. Continue oxygen at night time  6. Use bronchodilator ([place]) or Proair Respiclick (Coupon) 2 puffs every 4-6 hours if needed.  7. Follow up with Dr. GLyndel Safeabout repeat Isaac  8. Return in six months.  9. Blood test - IgA,G,M   CCalvenis stable. This is likely one of his better visits and a prolonged period in time. He has not had any significant infections nor does he appear to be having a recrudescence of his chronic pseudomonas colonization/pneumonia. We will keep him on infusions of immunoglobulin as well as his anti-inflammatory medication and regroup with him in 6 months or earlier if there is a problem. We will check immunoglobulin levels during the interval.   EAllena Katz MD [place]

## 2015-10-25 NOTE — Patient Instructions (Addendum)
  1. Continue Privagen infusions   2. Continue Symbicort 160 two inhalations two times per day. Decrease Qvar 80 to one puff twice a day for two weeks then try to discontinue Qvar  3. Continue Montelukast 53m one tablet one time per day   4. Continue Omeprazole 40 in AM and Ranitidine 300 in PM  5. Continue oxygen at night time  6. Use bronchodilator ([place]) or Proair Respiclick (Coupon) 2 puffs every 4-6 hours if needed.  7. Follow up with Dr. GLyndel Safeabout repeat endoscopy  8. Return in six months.  9 blood test - IgA,G,M

## 2015-10-31 ENCOUNTER — Other Ambulatory Visit: Payer: Self-pay | Admitting: Allergy and Immunology

## 2015-11-05 DIAGNOSIS — H04123 Dry eye syndrome of bilateral lacrimal glands: Secondary | ICD-10-CM | POA: Diagnosis not present

## 2015-11-12 DIAGNOSIS — D839 Common variable immunodeficiency, unspecified: Secondary | ICD-10-CM | POA: Diagnosis not present

## 2015-11-13 DIAGNOSIS — M5481 Occipital neuralgia: Secondary | ICD-10-CM | POA: Diagnosis not present

## 2015-11-13 DIAGNOSIS — M4307 Spondylolysis, lumbosacral region: Secondary | ICD-10-CM | POA: Diagnosis not present

## 2015-11-13 DIAGNOSIS — S335XXA Sprain of ligaments of lumbar spine, initial encounter: Secondary | ICD-10-CM | POA: Diagnosis not present

## 2015-11-13 DIAGNOSIS — M9903 Segmental and somatic dysfunction of lumbar region: Secondary | ICD-10-CM | POA: Diagnosis not present

## 2015-11-13 DIAGNOSIS — M9901 Segmental and somatic dysfunction of cervical region: Secondary | ICD-10-CM | POA: Diagnosis not present

## 2015-11-13 DIAGNOSIS — M9904 Segmental and somatic dysfunction of sacral region: Secondary | ICD-10-CM | POA: Diagnosis not present

## 2015-11-15 ENCOUNTER — Other Ambulatory Visit: Payer: Self-pay

## 2015-11-15 DIAGNOSIS — Z8711 Personal history of peptic ulcer disease: Secondary | ICD-10-CM | POA: Diagnosis not present

## 2015-11-15 DIAGNOSIS — F319 Bipolar disorder, unspecified: Secondary | ICD-10-CM | POA: Diagnosis not present

## 2015-11-15 DIAGNOSIS — K760 Fatty (change of) liver, not elsewhere classified: Secondary | ICD-10-CM | POA: Diagnosis not present

## 2015-11-15 DIAGNOSIS — G4733 Obstructive sleep apnea (adult) (pediatric): Secondary | ICD-10-CM | POA: Diagnosis not present

## 2015-11-15 DIAGNOSIS — K2960 Other gastritis without bleeding: Secondary | ICD-10-CM | POA: Diagnosis not present

## 2015-11-15 DIAGNOSIS — M9250 Juvenile osteochondrosis of tibia and fibula, unspecified leg: Secondary | ICD-10-CM | POA: Diagnosis not present

## 2015-11-15 DIAGNOSIS — K219 Gastro-esophageal reflux disease without esophagitis: Secondary | ICD-10-CM | POA: Diagnosis not present

## 2015-11-15 DIAGNOSIS — K2900 Acute gastritis without bleeding: Secondary | ICD-10-CM | POA: Diagnosis not present

## 2015-11-15 DIAGNOSIS — K2950 Unspecified chronic gastritis without bleeding: Secondary | ICD-10-CM | POA: Diagnosis not present

## 2015-11-15 DIAGNOSIS — G8929 Other chronic pain: Secondary | ICD-10-CM | POA: Diagnosis not present

## 2015-11-15 DIAGNOSIS — K253 Acute gastric ulcer without hemorrhage or perforation: Secondary | ICD-10-CM | POA: Diagnosis not present

## 2015-11-15 DIAGNOSIS — F419 Anxiety disorder, unspecified: Secondary | ICD-10-CM | POA: Diagnosis not present

## 2015-11-15 DIAGNOSIS — Z09 Encounter for follow-up examination after completed treatment for conditions other than malignant neoplasm: Secondary | ICD-10-CM | POA: Diagnosis not present

## 2015-11-19 DIAGNOSIS — M542 Cervicalgia: Secondary | ICD-10-CM | POA: Diagnosis not present

## 2015-11-19 DIAGNOSIS — J449 Chronic obstructive pulmonary disease, unspecified: Secondary | ICD-10-CM | POA: Diagnosis not present

## 2015-11-28 DIAGNOSIS — Z23 Encounter for immunization: Secondary | ICD-10-CM | POA: Diagnosis not present

## 2015-11-28 DIAGNOSIS — Z6833 Body mass index (BMI) 33.0-33.9, adult: Secondary | ICD-10-CM | POA: Diagnosis not present

## 2015-11-28 DIAGNOSIS — D832 Common variable immunodeficiency with autoantibodies to B- or T-cells: Secondary | ICD-10-CM | POA: Diagnosis not present

## 2015-11-28 DIAGNOSIS — Z0001 Encounter for general adult medical examination with abnormal findings: Secondary | ICD-10-CM | POA: Diagnosis not present

## 2015-12-05 DIAGNOSIS — M542 Cervicalgia: Secondary | ICD-10-CM | POA: Diagnosis not present

## 2015-12-05 DIAGNOSIS — M65321 Trigger finger, right index finger: Secondary | ICD-10-CM | POA: Diagnosis not present

## 2015-12-05 DIAGNOSIS — D838 Other common variable immunodeficiencies: Secondary | ICD-10-CM | POA: Diagnosis not present

## 2015-12-05 DIAGNOSIS — K219 Gastro-esophageal reflux disease without esophagitis: Secondary | ICD-10-CM | POA: Diagnosis not present

## 2015-12-05 DIAGNOSIS — R0789 Other chest pain: Secondary | ICD-10-CM | POA: Diagnosis not present

## 2015-12-05 DIAGNOSIS — E782 Mixed hyperlipidemia: Secondary | ICD-10-CM | POA: Diagnosis not present

## 2015-12-05 DIAGNOSIS — R7301 Impaired fasting glucose: Secondary | ICD-10-CM | POA: Diagnosis not present

## 2015-12-05 DIAGNOSIS — R51 Headache: Secondary | ICD-10-CM | POA: Diagnosis not present

## 2015-12-06 DIAGNOSIS — Z5181 Encounter for therapeutic drug level monitoring: Secondary | ICD-10-CM | POA: Diagnosis not present

## 2015-12-06 DIAGNOSIS — G894 Chronic pain syndrome: Secondary | ICD-10-CM | POA: Diagnosis not present

## 2015-12-06 DIAGNOSIS — M150 Primary generalized (osteo)arthritis: Secondary | ICD-10-CM | POA: Diagnosis not present

## 2015-12-06 DIAGNOSIS — M5440 Lumbago with sciatica, unspecified side: Secondary | ICD-10-CM | POA: Diagnosis not present

## 2015-12-10 DIAGNOSIS — D839 Common variable immunodeficiency, unspecified: Secondary | ICD-10-CM | POA: Diagnosis not present

## 2015-12-12 DIAGNOSIS — I10 Essential (primary) hypertension: Secondary | ICD-10-CM | POA: Diagnosis not present

## 2015-12-12 DIAGNOSIS — R079 Chest pain, unspecified: Secondary | ICD-10-CM | POA: Diagnosis not present

## 2015-12-12 DIAGNOSIS — R072 Precordial pain: Secondary | ICD-10-CM | POA: Diagnosis not present

## 2015-12-12 DIAGNOSIS — E785 Hyperlipidemia, unspecified: Secondary | ICD-10-CM | POA: Diagnosis not present

## 2015-12-19 DIAGNOSIS — M542 Cervicalgia: Secondary | ICD-10-CM | POA: Diagnosis not present

## 2015-12-19 DIAGNOSIS — J060 Acute laryngopharyngitis: Secondary | ICD-10-CM | POA: Diagnosis not present

## 2015-12-19 DIAGNOSIS — J449 Chronic obstructive pulmonary disease, unspecified: Secondary | ICD-10-CM | POA: Diagnosis not present

## 2016-01-07 DIAGNOSIS — D839 Common variable immunodeficiency, unspecified: Secondary | ICD-10-CM | POA: Diagnosis not present

## 2016-01-10 DIAGNOSIS — R072 Precordial pain: Secondary | ICD-10-CM | POA: Diagnosis not present

## 2016-01-10 DIAGNOSIS — K219 Gastro-esophageal reflux disease without esophagitis: Secondary | ICD-10-CM | POA: Diagnosis not present

## 2016-01-10 DIAGNOSIS — I10 Essential (primary) hypertension: Secondary | ICD-10-CM | POA: Diagnosis not present

## 2016-01-10 DIAGNOSIS — J45909 Unspecified asthma, uncomplicated: Secondary | ICD-10-CM | POA: Diagnosis not present

## 2016-01-19 DIAGNOSIS — J449 Chronic obstructive pulmonary disease, unspecified: Secondary | ICD-10-CM | POA: Diagnosis not present

## 2016-01-19 DIAGNOSIS — M542 Cervicalgia: Secondary | ICD-10-CM | POA: Diagnosis not present

## 2016-02-04 DIAGNOSIS — D839 Common variable immunodeficiency, unspecified: Secondary | ICD-10-CM | POA: Diagnosis not present

## 2016-02-06 DIAGNOSIS — G8929 Other chronic pain: Secondary | ICD-10-CM | POA: Diagnosis not present

## 2016-02-06 DIAGNOSIS — M150 Primary generalized (osteo)arthritis: Secondary | ICD-10-CM | POA: Diagnosis not present

## 2016-02-06 DIAGNOSIS — G894 Chronic pain syndrome: Secondary | ICD-10-CM | POA: Diagnosis not present

## 2016-02-06 DIAGNOSIS — M5440 Lumbago with sciatica, unspecified side: Secondary | ICD-10-CM | POA: Diagnosis not present

## 2016-02-13 DIAGNOSIS — M9902 Segmental and somatic dysfunction of thoracic region: Secondary | ICD-10-CM | POA: Diagnosis not present

## 2016-02-13 DIAGNOSIS — M546 Pain in thoracic spine: Secondary | ICD-10-CM | POA: Diagnosis not present

## 2016-02-13 DIAGNOSIS — G44209 Tension-type headache, unspecified, not intractable: Secondary | ICD-10-CM | POA: Diagnosis not present

## 2016-02-13 DIAGNOSIS — M545 Low back pain: Secondary | ICD-10-CM | POA: Diagnosis not present

## 2016-02-13 DIAGNOSIS — M9903 Segmental and somatic dysfunction of lumbar region: Secondary | ICD-10-CM | POA: Diagnosis not present

## 2016-02-13 DIAGNOSIS — M9901 Segmental and somatic dysfunction of cervical region: Secondary | ICD-10-CM | POA: Diagnosis not present

## 2016-02-19 DIAGNOSIS — I10 Essential (primary) hypertension: Secondary | ICD-10-CM | POA: Diagnosis not present

## 2016-02-19 DIAGNOSIS — J45909 Unspecified asthma, uncomplicated: Secondary | ICD-10-CM | POA: Diagnosis not present

## 2016-02-19 DIAGNOSIS — R072 Precordial pain: Secondary | ICD-10-CM | POA: Diagnosis not present

## 2016-02-19 DIAGNOSIS — M542 Cervicalgia: Secondary | ICD-10-CM | POA: Diagnosis not present

## 2016-02-19 DIAGNOSIS — J449 Chronic obstructive pulmonary disease, unspecified: Secondary | ICD-10-CM | POA: Diagnosis not present

## 2016-02-19 DIAGNOSIS — K219 Gastro-esophageal reflux disease without esophagitis: Secondary | ICD-10-CM | POA: Diagnosis not present

## 2016-02-26 DIAGNOSIS — R0789 Other chest pain: Secondary | ICD-10-CM | POA: Diagnosis not present

## 2016-03-03 ENCOUNTER — Telehealth: Payer: Self-pay

## 2016-03-03 DIAGNOSIS — D839 Common variable immunodeficiency, unspecified: Secondary | ICD-10-CM | POA: Diagnosis not present

## 2016-03-03 NOTE — Telephone Encounter (Signed)
Pt at [facility] receiving Hizentra infusion. Nurse call asking if patient may have an order for Mylanta. C/O indigestion. One time order given for Mylanta 30 cc.

## 2016-03-04 NOTE — Telephone Encounter (Signed)
SPOKE WITH THE NURSE YESTERDAY AND [place] AUTHORIZED IT. I WILL SEE ABOUT A STANDING ORDER.

## 2016-03-04 NOTE — Telephone Encounter (Signed)
Seems like there should be a standard order for meds such as these. Maybe it can be worked into the infusion protocol that the hospital is using.

## 2016-03-06 DIAGNOSIS — I358 Other nonrheumatic aortic valve disorders: Secondary | ICD-10-CM | POA: Diagnosis not present

## 2016-03-06 DIAGNOSIS — R0789 Other chest pain: Secondary | ICD-10-CM | POA: Diagnosis not present

## 2016-03-11 DIAGNOSIS — E7800 Pure hypercholesterolemia, unspecified: Secondary | ICD-10-CM | POA: Diagnosis not present

## 2016-03-11 DIAGNOSIS — F5101 Primary insomnia: Secondary | ICD-10-CM | POA: Diagnosis not present

## 2016-03-11 DIAGNOSIS — F3131 Bipolar disorder, current episode depressed, mild: Secondary | ICD-10-CM | POA: Diagnosis not present

## 2016-03-11 DIAGNOSIS — M79604 Pain in right leg: Secondary | ICD-10-CM | POA: Diagnosis not present

## 2016-03-11 DIAGNOSIS — M7989 Other specified soft tissue disorders: Secondary | ICD-10-CM | POA: Diagnosis not present

## 2016-03-11 DIAGNOSIS — D838 Other common variable immunodeficiencies: Secondary | ICD-10-CM | POA: Diagnosis not present

## 2016-03-18 DIAGNOSIS — J449 Chronic obstructive pulmonary disease, unspecified: Secondary | ICD-10-CM | POA: Diagnosis not present

## 2016-03-18 DIAGNOSIS — M542 Cervicalgia: Secondary | ICD-10-CM | POA: Diagnosis not present

## 2016-03-27 ENCOUNTER — Telehealth: Payer: Self-pay | Admitting: Allergy and Immunology

## 2016-03-27 ENCOUNTER — Other Ambulatory Visit: Payer: Self-pay

## 2016-03-27 MED ORDER — BUDESONIDE-FORMOTEROL FUMARATE 160-4.5 MCG/ACT IN AERO: 2.0000 | INHALATION_SPRAY | Freq: Two times a day (BID) | RESPIRATORY_TRACT | Status: DC

## 2016-03-27 NOTE — Telephone Encounter (Signed)
Change Pharmacy to [facility] in [place].   Needs a refill on SYMBICORT 160-4.5

## 2016-03-27 NOTE — Telephone Encounter (Signed)
Sent in refill to [facility] in [place]

## 2016-03-31 DIAGNOSIS — D839 Common variable immunodeficiency, unspecified: Secondary | ICD-10-CM | POA: Diagnosis not present

## 2016-04-01 DIAGNOSIS — I10 Essential (primary) hypertension: Secondary | ICD-10-CM | POA: Diagnosis not present

## 2016-04-01 DIAGNOSIS — F419 Anxiety disorder, unspecified: Secondary | ICD-10-CM | POA: Diagnosis not present

## 2016-04-01 DIAGNOSIS — I209 Angina pectoris, unspecified: Secondary | ICD-10-CM | POA: Diagnosis not present

## 2016-04-01 DIAGNOSIS — F329 Major depressive disorder, single episode, unspecified: Secondary | ICD-10-CM | POA: Diagnosis not present

## 2016-04-02 DIAGNOSIS — G8929 Other chronic pain: Secondary | ICD-10-CM | POA: Diagnosis not present

## 2016-04-02 DIAGNOSIS — G894 Chronic pain syndrome: Secondary | ICD-10-CM | POA: Diagnosis not present

## 2016-04-02 DIAGNOSIS — M150 Primary generalized (osteo)arthritis: Secondary | ICD-10-CM | POA: Diagnosis not present

## 2016-04-02 DIAGNOSIS — M5442 Lumbago with sciatica, left side: Secondary | ICD-10-CM | POA: Diagnosis not present

## 2016-04-18 DIAGNOSIS — J449 Chronic obstructive pulmonary disease, unspecified: Secondary | ICD-10-CM | POA: Diagnosis not present

## 2016-04-18 DIAGNOSIS — M542 Cervicalgia: Secondary | ICD-10-CM | POA: Diagnosis not present

## 2016-04-24 ENCOUNTER — Ambulatory Visit: Payer: Medicare HMO | Admitting: Allergy and Immunology

## 2016-04-28 DIAGNOSIS — D839 Common variable immunodeficiency, unspecified: Secondary | ICD-10-CM | POA: Diagnosis not present

## 2016-04-30 ENCOUNTER — Other Ambulatory Visit: Payer: Self-pay | Admitting: Allergy and Immunology

## 2016-04-30 DIAGNOSIS — M545 Low back pain: Secondary | ICD-10-CM | POA: Diagnosis not present

## 2016-04-30 DIAGNOSIS — J441 Chronic obstructive pulmonary disease with (acute) exacerbation: Secondary | ICD-10-CM | POA: Diagnosis not present

## 2016-04-30 DIAGNOSIS — M65332 Trigger finger, left middle finger: Secondary | ICD-10-CM | POA: Diagnosis not present

## 2016-05-05 DIAGNOSIS — M5414 Radiculopathy, thoracic region: Secondary | ICD-10-CM | POA: Diagnosis not present

## 2016-05-05 DIAGNOSIS — M9902 Segmental and somatic dysfunction of thoracic region: Secondary | ICD-10-CM | POA: Diagnosis not present

## 2016-05-05 DIAGNOSIS — M9903 Segmental and somatic dysfunction of lumbar region: Secondary | ICD-10-CM | POA: Diagnosis not present

## 2016-05-05 DIAGNOSIS — M9901 Segmental and somatic dysfunction of cervical region: Secondary | ICD-10-CM | POA: Diagnosis not present

## 2016-05-05 DIAGNOSIS — G44209 Tension-type headache, unspecified, not intractable: Secondary | ICD-10-CM | POA: Diagnosis not present

## 2016-05-18 DIAGNOSIS — M542 Cervicalgia: Secondary | ICD-10-CM | POA: Diagnosis not present

## 2016-05-18 DIAGNOSIS — J449 Chronic obstructive pulmonary disease, unspecified: Secondary | ICD-10-CM | POA: Diagnosis not present

## 2016-05-20 DIAGNOSIS — L918 Other hypertrophic disorders of the skin: Secondary | ICD-10-CM | POA: Diagnosis not present

## 2016-05-22 ENCOUNTER — Encounter: Payer: Self-pay | Admitting: Allergy and Immunology

## 2016-05-22 ENCOUNTER — Ambulatory Visit (INDEPENDENT_AMBULATORY_CARE_PROVIDER_SITE_OTHER): Payer: Commercial Managed Care - HMO | Admitting: Allergy and Immunology

## 2016-05-22 VITALS — BP 128/78 | HR 84 | Resp 16

## 2016-05-22 DIAGNOSIS — G4734 Idiopathic sleep related nonobstructive alveolar hypoventilation: Secondary | ICD-10-CM | POA: Diagnosis not present

## 2016-05-22 DIAGNOSIS — D839 Common variable immunodeficiency, unspecified: Secondary | ICD-10-CM | POA: Diagnosis not present

## 2016-05-22 DIAGNOSIS — J4550 Severe persistent asthma, uncomplicated: Secondary | ICD-10-CM

## 2016-05-22 DIAGNOSIS — K219 Gastro-esophageal reflux disease without esophagitis: Secondary | ICD-10-CM | POA: Diagnosis not present

## 2016-05-22 DIAGNOSIS — G4733 Obstructive sleep apnea (adult) (pediatric): Secondary | ICD-10-CM

## 2016-05-22 NOTE — Patient Instructions (Addendum)
  1. Continue Privagen infusions   2. Continue Symbicort 160 two inhalations two times per day.   3. Add Qvar 80 two puffs twice a day to Symbicort during 'flare up'  3. Continue Montelukast 16m one tablet one time per day   4. Continue Omeprazole 40 in AM and Ranitidine 300 in PM  5. Continue oxygen at night time  6. Use bronchodilator (Duoneb) or Proair Respiclick (Coupon) 2 puffs every 4-6 hours if needed.  7. Check IgG and CBC w/diff prior to next infusion  8. Return in six months.

## 2016-05-22 NOTE — Progress Notes (Signed)
Follow-up Note  Referring Provider: Rochel Brome, MD Primary Provider: Rochel Brome, MD Date of Office Visit: 05/22/2016  Subjective:   Isaac Walsh (DOB: 1966/06/05) is a 50 y.o. male who returns to the [facility] on 05/22/2016 in re-evaluation of the following:  HPI: Isaac Walsh returns to this clinic in reevaluation of his CvID treated with IV Immunoglobulin complicated by a history of chronic pseudomonas infection, bronchiectasis, obstructive lung disease with component of asthma, and autoimmune lymphopenia. I've not seen him in his clinic since October 2016.   His infusions are going well without any incident. He does think that he feels better in general at the beginning of his infusion then towards the nadir of his infusion.  According to Isaac Walsh he has received two antibiotics since I've seen him in this clinic last and both of these events were associated with rhinitis and subsequently bronchitis. He has responded appropriately to both these antibiotics with resolution of his respiratory tract symptoms. He has not had a pneumonia or sepsis. He did have one of his episodes of sinusitis and bronchitis about 2 weeks ago and he is slowly recovering from this event.  He still has chronic coughing and wheezing and chest congestion but this is definitely at a plateau and he has not had any significant flares of this condition that have required the administration of systemic steroids. He rarely uses a short acting bronchodilator. He does not exert himself to any large degree because of his musculoskeletal pain issue.  His head has been doing okay although he does have some chronic congestion. He can smell food without difficulty.  His reflux is very active if he does not use his omeprazole and ranitidine. If he misses ranitidine he develops problems with reflux. When using a combination of both he has tolerability of his symptoms. He still has some  intermittent chest pain believed to be secondary to this condition.  Isaac Walsh on oxygen at nighttime. He is not using a CPAP machine.    Medication List           acyclovir 400 MG tablet  Commonly known as:  ZOVIRAX  Take 400 mg by mouth 5 (five) times daily. As needed     AMBIEN 10 MG tablet  Generic drug:  zolpidem  Take 10 mg by mouth at bedtime.     budesonide-formoterol 160-4.5 MCG/ACT inhaler  Commonly known as:  SYMBICORT  Inhale 2 puffs into the lungs 2 (two) times daily. With spacer     carisoprodol 350 MG tablet  Commonly known as:  SOMA  Take 350 mg by mouth every 6 (six) hours as needed for muscle spasms.     fenofibrate micronized 134 MG capsule  Commonly known as:  LOFIBRA  Take 134 mg by mouth daily.     finasteride 1 MG tablet  Commonly known as:  PROPECIA  Take 1 mg by mouth daily.     HEMATINIC PLUS COMPLEX 106-1 MG Tabs  Take 1 capsule by mouth daily.     Immune Globulin 10% 5 GM/50ML Soln  Commonly known as:  OCTAGAM  Inject 50 g into the vein every 30 (thirty) days.     ipratropium-albuterol 0.5-2.5 (3) MG/3ML Soln  Commonly known as:  DUONEB  Take 3 mLs by nebulization every 6 (six) hours as needed.     levothyroxine 100 MCG tablet  Commonly known as:  SYNTHROID, LEVOTHROID  Take 100 mcg by mouth daily before breakfast.  montelukast 10 MG tablet  Commonly known as:  SINGULAIR  Take 10 mg by mouth at bedtime.     nitroGLYCERIN 0.4 MG SL tablet  Commonly known as:  NITROSTAT  Place 0.4 mg under the tongue as needed.     omeprazole 40 MG capsule  Commonly known as:  PRILOSEC  Take 40 mg by mouth daily.     OXYCONTIN 20 mg 12 hr tablet  Generic drug:  oxyCODONE  Take 20 mg by mouth 3 (three) times daily.     Oxycodone HCl 10 MG Tabs  Take 10 mg by mouth 4 (four) times daily.     pantoprazole 40 MG tablet  Commonly known as:  PROTONIX  Take 40 mg by mouth 2 (two) times daily.     ranitidine 300 MG tablet    Commonly known as:  ZANTAC  TAKE ONE TABLET BY MOUTH AT BEDTIME. MUST MAKE APPOINTMENT     sertraline 100 MG tablet  Commonly known as:  ZOLOFT  Take 100 mg by mouth. Take 1 1/2 tablets by mouth     SYSTANE OP  Apply to eye every 4 (four) hours.     traZODone 100 MG tablet  Commonly known as:  DESYREL  Take 200 mg by mouth at bedtime.     VENTOLIN HFA 108 (90 Base) MCG/ACT inhaler  Generic drug:  albuterol  Inhale 2 puffs into the lungs every 4 (four) hours as needed for wheezing or shortness of breath.        Past Medical History  Diagnosis Date  . Abnormal heart rhythm   . Hypothyroidism   . Trigger finger   . COPD (chronic obstructive pulmonary disease) ([place])   . Chronic pain syndrome   . Common variable immunodeficiency ([place])   . Congenital dysgammaglobulinemia ([place])   . Depression with anxiety   . Fatigue   . GERD (gastroesophageal reflux disease)   . Hypercholesterolemia   . Nonalcoholic fatty liver disease   . OSA (obstructive sleep apnea)   . Osgood-Schlatter's disease   . Other nonspecific abnormal finding of lung field   . SCID (severe combined immunodeficiency disease) ([place])   . Unspecified deficiency anemia   . Xerostomia   . Renal stones   . Plantar fasciitis   . Sciatica   . Lymphoma ([place])     as a child  . Asthma     Past Surgical History  Procedure Laterality Date  . Sinusectomy    . Liver surgery      10% of liver removed  . Lymphadenectomy    . Tympanostomy tube placement      Allergies  Allergen Reactions  . Androgel Pump [Testosterone]     rash  . Avelox [Moxifloxacin]     Blisters and "doesn't work for me" per pt  . Ceclor [Cefaclor]   . Celestone [Betamethasone]   . Cephalosporins Hives  . Claritin [Loratadine]   . Corticosteroids Hives  . Effexor [Venlafaxine]   . Flumist [Flu Virus Vaccine]     Pt reports he can tolerate flu shot but not mist  . Naprosyn [Naproxen]   . Quinolones Hives  . Tizanidine   . Toradol  [Ketorolac Tromethamine]   . Vicodin [Hydrocodone-Acetaminophen]   . Rocephin [Ceftriaxone] Hives and Rash    Review of systems negative except as noted in HPI / PMHx or noted below:  Review of Systems  Constitutional: Negative.   HENT: Negative.   Eyes: Negative.   Respiratory: Negative.  Cardiovascular: Negative.   Gastrointestinal: Negative.   Genitourinary: Negative.   Musculoskeletal: Negative.   Skin: Negative.   Neurological: Negative.   Endo/Heme/Allergies: Negative.   Psychiatric/Behavioral: Negative.      Objective:   Filed Vitals:   05/22/16 1511  BP: 128/78  Pulse: 84  Resp: 16          Physical Exam  Constitutional: He is well-developed, well-nourished, and in no distress.  HENT:  Head: Normocephalic.  Right Ear: Tympanic membrane, external ear and ear canal normal.  Left Ear: Tympanic membrane, external ear and ear canal normal.  Nose: Nose normal. No mucosal edema or rhinorrhea.  Mouth/Throat: Uvula is midline, oropharynx is clear and moist and mucous membranes are normal. No oropharyngeal exudate.  Septal perforation  Eyes: Conjunctivae are normal.  Neck: Trachea normal. No tracheal tenderness present. No tracheal deviation present. No thyromegaly present.  Cardiovascular: Normal rate, regular rhythm, S1 normal, S2 normal and normal heart sounds.   No murmur heard. Pulmonary/Chest: No stridor. No respiratory distress. He has wheezes. He has no rales.  Musculoskeletal: He exhibits no edema.  Lymphadenopathy:       Head (right side): No tonsillar adenopathy present.       Head (left side): No tonsillar adenopathy present.    He has no cervical adenopathy.  Neurological: He is alert. Gait normal.  Skin: No rash noted. He is not diaphoretic. No erythema. Nails show no clubbing.  Psychiatric: Mood and affect normal.    Diagnostics: Oxygen saturation at rest on room air was 93%   Spirometry was performed and demonstrated an FEV1 of 1.37 at 54 %  of predicted.  The patient had an Asthma Control Test with the following results:  .    Assessment and Plan:   1. CVID (common variable immunodeficiency) ([place])   2. Asthma, severe persistent, uncomplicated   3. Chronic intermittent hypoxia with obstructive sleep apnea   4. Gastroesophageal reflux disease, esophagitis presence not specified     1. Continue Privagen infusions   2. Continue Symbicort 160 two inhalations two times per day.   3. Add Qvar 80 two puffs twice a day to Symbicort during 'flare up'  3. Continue Montelukast 32m one tablet one time per day   4. Continue Omeprazole 40 in AM and Ranitidine 300 in PM  5. Continue oxygen at night time  6. Use bronchodilator (Duoneb) or Proair Respiclick 2 puffs every 4-6 hours if needed.  7. Check IgG and CBC w/diff prior to next infusion  8. Return in six months.  Overall CNainoaappears to be doing well from a respiratory and immunological standpoint and I do not want to change any of his medical therapy at this point in time regarding treatment of these issues. I will see him back in this clinic in 6 months or earlier if there is a problem. If his nadir IgG level is low we may need to decrease his IVIG infusion interval.  EAllena Katz MD [facility]

## 2016-05-26 DIAGNOSIS — D839 Common variable immunodeficiency, unspecified: Secondary | ICD-10-CM | POA: Diagnosis not present

## 2016-05-29 ENCOUNTER — Telehealth: Payer: Self-pay

## 2016-05-29 DIAGNOSIS — G894 Chronic pain syndrome: Secondary | ICD-10-CM | POA: Diagnosis not present

## 2016-05-29 DIAGNOSIS — G8929 Other chronic pain: Secondary | ICD-10-CM | POA: Diagnosis not present

## 2016-05-29 DIAGNOSIS — M5440 Lumbago with sciatica, unspecified side: Secondary | ICD-10-CM | POA: Diagnosis not present

## 2016-05-29 DIAGNOSIS — M150 Primary generalized (osteo)arthritis: Secondary | ICD-10-CM | POA: Diagnosis not present

## 2016-05-29 DIAGNOSIS — Z5181 Encounter for therapeutic drug level monitoring: Secondary | ICD-10-CM | POA: Diagnosis not present

## 2016-05-29 NOTE — Telephone Encounter (Signed)
Left message to call office

## 2016-05-29 NOTE — Telephone Encounter (Signed)
Pt informed of IgG results. To continue all medications as prescribed per Dr. Neldon Mc.

## 2016-06-10 DIAGNOSIS — F419 Anxiety disorder, unspecified: Secondary | ICD-10-CM | POA: Diagnosis not present

## 2016-06-10 DIAGNOSIS — R072 Precordial pain: Secondary | ICD-10-CM | POA: Diagnosis not present

## 2016-06-10 DIAGNOSIS — F329 Major depressive disorder, single episode, unspecified: Secondary | ICD-10-CM | POA: Diagnosis not present

## 2016-06-10 DIAGNOSIS — I10 Essential (primary) hypertension: Secondary | ICD-10-CM | POA: Diagnosis not present

## 2016-06-10 DIAGNOSIS — I209 Angina pectoris, unspecified: Secondary | ICD-10-CM | POA: Diagnosis not present

## 2016-06-13 DIAGNOSIS — F3131 Bipolar disorder, current episode depressed, mild: Secondary | ICD-10-CM | POA: Diagnosis not present

## 2016-06-13 DIAGNOSIS — L700 Acne vulgaris: Secondary | ICD-10-CM | POA: Diagnosis not present

## 2016-06-13 DIAGNOSIS — F5101 Primary insomnia: Secondary | ICD-10-CM | POA: Diagnosis not present

## 2016-06-13 DIAGNOSIS — M25561 Pain in right knee: Secondary | ICD-10-CM | POA: Diagnosis not present

## 2016-06-13 DIAGNOSIS — D838 Other common variable immunodeficiencies: Secondary | ICD-10-CM | POA: Diagnosis not present

## 2016-06-13 DIAGNOSIS — E782 Mixed hyperlipidemia: Secondary | ICD-10-CM | POA: Diagnosis not present

## 2016-06-17 DIAGNOSIS — R072 Precordial pain: Secondary | ICD-10-CM | POA: Diagnosis not present

## 2016-06-18 DIAGNOSIS — M542 Cervicalgia: Secondary | ICD-10-CM | POA: Diagnosis not present

## 2016-06-18 DIAGNOSIS — J449 Chronic obstructive pulmonary disease, unspecified: Secondary | ICD-10-CM | POA: Diagnosis not present

## 2016-06-23 DIAGNOSIS — D839 Common variable immunodeficiency, unspecified: Secondary | ICD-10-CM | POA: Diagnosis not present

## 2016-07-07 DIAGNOSIS — L300 Nummular dermatitis: Secondary | ICD-10-CM | POA: Diagnosis not present

## 2016-07-07 DIAGNOSIS — L299 Pruritus, unspecified: Secondary | ICD-10-CM | POA: Diagnosis not present

## 2016-07-07 DIAGNOSIS — B356 Tinea cruris: Secondary | ICD-10-CM | POA: Diagnosis not present

## 2016-07-07 DIAGNOSIS — B353 Tinea pedis: Secondary | ICD-10-CM | POA: Diagnosis not present

## 2016-07-18 DIAGNOSIS — J449 Chronic obstructive pulmonary disease, unspecified: Secondary | ICD-10-CM | POA: Diagnosis not present

## 2016-07-21 DIAGNOSIS — D839 Common variable immunodeficiency, unspecified: Secondary | ICD-10-CM | POA: Diagnosis not present

## 2016-07-23 DIAGNOSIS — B353 Tinea pedis: Secondary | ICD-10-CM | POA: Diagnosis not present

## 2016-07-23 DIAGNOSIS — L570 Actinic keratosis: Secondary | ICD-10-CM | POA: Diagnosis not present

## 2016-07-23 DIAGNOSIS — L301 Dyshidrosis [pompholyx]: Secondary | ICD-10-CM | POA: Diagnosis not present

## 2016-07-30 DIAGNOSIS — G894 Chronic pain syndrome: Secondary | ICD-10-CM | POA: Diagnosis not present

## 2016-07-30 DIAGNOSIS — M5441 Lumbago with sciatica, right side: Secondary | ICD-10-CM | POA: Diagnosis not present

## 2016-07-30 DIAGNOSIS — G8929 Other chronic pain: Secondary | ICD-10-CM | POA: Diagnosis not present

## 2016-07-30 DIAGNOSIS — M150 Primary generalized (osteo)arthritis: Secondary | ICD-10-CM | POA: Diagnosis not present

## 2016-07-30 DIAGNOSIS — M5442 Lumbago with sciatica, left side: Secondary | ICD-10-CM | POA: Diagnosis not present

## 2016-08-18 DIAGNOSIS — M542 Cervicalgia: Secondary | ICD-10-CM | POA: Diagnosis not present

## 2016-08-18 DIAGNOSIS — D839 Common variable immunodeficiency, unspecified: Secondary | ICD-10-CM | POA: Diagnosis not present

## 2016-08-18 DIAGNOSIS — J449 Chronic obstructive pulmonary disease, unspecified: Secondary | ICD-10-CM | POA: Diagnosis not present

## 2016-08-27 DIAGNOSIS — L810 Postinflammatory hyperpigmentation: Secondary | ICD-10-CM | POA: Diagnosis not present

## 2016-08-27 DIAGNOSIS — B356 Tinea cruris: Secondary | ICD-10-CM | POA: Diagnosis not present

## 2016-08-29 DIAGNOSIS — G894 Chronic pain syndrome: Secondary | ICD-10-CM | POA: Diagnosis not present

## 2016-08-29 DIAGNOSIS — M150 Primary generalized (osteo)arthritis: Secondary | ICD-10-CM | POA: Diagnosis not present

## 2016-08-29 DIAGNOSIS — G8929 Other chronic pain: Secondary | ICD-10-CM | POA: Diagnosis not present

## 2016-08-29 DIAGNOSIS — M5440 Lumbago with sciatica, unspecified side: Secondary | ICD-10-CM | POA: Diagnosis not present

## 2016-09-15 DIAGNOSIS — D839 Common variable immunodeficiency, unspecified: Secondary | ICD-10-CM | POA: Diagnosis not present

## 2016-09-16 DIAGNOSIS — I209 Angina pectoris, unspecified: Secondary | ICD-10-CM | POA: Diagnosis not present

## 2016-09-16 DIAGNOSIS — R072 Precordial pain: Secondary | ICD-10-CM | POA: Diagnosis not present

## 2016-09-16 DIAGNOSIS — I10 Essential (primary) hypertension: Secondary | ICD-10-CM | POA: Diagnosis not present

## 2016-09-16 DIAGNOSIS — N5201 Erectile dysfunction due to arterial insufficiency: Secondary | ICD-10-CM | POA: Diagnosis not present

## 2016-09-17 DIAGNOSIS — L708 Other acne: Secondary | ICD-10-CM | POA: Diagnosis not present

## 2016-09-17 DIAGNOSIS — E782 Mixed hyperlipidemia: Secondary | ICD-10-CM | POA: Diagnosis not present

## 2016-09-17 DIAGNOSIS — K219 Gastro-esophageal reflux disease without esophagitis: Secondary | ICD-10-CM | POA: Diagnosis not present

## 2016-09-17 DIAGNOSIS — D838 Other common variable immunodeficiencies: Secondary | ICD-10-CM | POA: Diagnosis not present

## 2016-09-17 DIAGNOSIS — I208 Other forms of angina pectoris: Secondary | ICD-10-CM | POA: Diagnosis not present

## 2016-09-17 DIAGNOSIS — M545 Low back pain: Secondary | ICD-10-CM | POA: Diagnosis not present

## 2016-09-17 DIAGNOSIS — F5101 Primary insomnia: Secondary | ICD-10-CM | POA: Diagnosis not present

## 2016-09-17 DIAGNOSIS — Z23 Encounter for immunization: Secondary | ICD-10-CM | POA: Diagnosis not present

## 2016-09-17 DIAGNOSIS — F3131 Bipolar disorder, current episode depressed, mild: Secondary | ICD-10-CM | POA: Diagnosis not present

## 2016-09-18 DIAGNOSIS — J449 Chronic obstructive pulmonary disease, unspecified: Secondary | ICD-10-CM | POA: Diagnosis not present

## 2016-09-18 DIAGNOSIS — M542 Cervicalgia: Secondary | ICD-10-CM | POA: Diagnosis not present

## 2016-09-26 DIAGNOSIS — G8929 Other chronic pain: Secondary | ICD-10-CM | POA: Diagnosis not present

## 2016-09-26 DIAGNOSIS — M5440 Lumbago with sciatica, unspecified side: Secondary | ICD-10-CM | POA: Diagnosis not present

## 2016-09-26 DIAGNOSIS — G894 Chronic pain syndrome: Secondary | ICD-10-CM | POA: Diagnosis not present

## 2016-09-26 DIAGNOSIS — M150 Primary generalized (osteo)arthritis: Secondary | ICD-10-CM | POA: Diagnosis not present

## 2016-10-13 DIAGNOSIS — D839 Common variable immunodeficiency, unspecified: Secondary | ICD-10-CM | POA: Diagnosis not present

## 2016-10-18 DIAGNOSIS — J449 Chronic obstructive pulmonary disease, unspecified: Secondary | ICD-10-CM | POA: Diagnosis not present

## 2016-10-18 DIAGNOSIS — M542 Cervicalgia: Secondary | ICD-10-CM | POA: Diagnosis not present

## 2016-10-20 DIAGNOSIS — J441 Chronic obstructive pulmonary disease with (acute) exacerbation: Secondary | ICD-10-CM | POA: Diagnosis not present

## 2016-10-27 ENCOUNTER — Other Ambulatory Visit: Payer: Self-pay | Admitting: Allergy and Immunology

## 2016-10-28 DIAGNOSIS — G8929 Other chronic pain: Secondary | ICD-10-CM | POA: Diagnosis not present

## 2016-10-28 DIAGNOSIS — M150 Primary generalized (osteo)arthritis: Secondary | ICD-10-CM | POA: Diagnosis not present

## 2016-10-28 DIAGNOSIS — G894 Chronic pain syndrome: Secondary | ICD-10-CM | POA: Diagnosis not present

## 2016-10-28 DIAGNOSIS — M5440 Lumbago with sciatica, unspecified side: Secondary | ICD-10-CM | POA: Diagnosis not present

## 2016-11-10 DIAGNOSIS — D839 Common variable immunodeficiency, unspecified: Secondary | ICD-10-CM | POA: Diagnosis not present

## 2016-11-18 DIAGNOSIS — M542 Cervicalgia: Secondary | ICD-10-CM | POA: Diagnosis not present

## 2016-11-18 DIAGNOSIS — J449 Chronic obstructive pulmonary disease, unspecified: Secondary | ICD-10-CM | POA: Diagnosis not present

## 2016-11-25 DIAGNOSIS — H04123 Dry eye syndrome of bilateral lacrimal glands: Secondary | ICD-10-CM | POA: Diagnosis not present

## 2016-11-27 ENCOUNTER — Encounter: Payer: Self-pay | Admitting: Allergy and Immunology

## 2016-11-27 ENCOUNTER — Ambulatory Visit (INDEPENDENT_AMBULATORY_CARE_PROVIDER_SITE_OTHER): Payer: Commercial Managed Care - HMO | Admitting: Allergy and Immunology

## 2016-11-27 VITALS — BP 112/82 | HR 80 | Resp 16

## 2016-11-27 DIAGNOSIS — G4734 Idiopathic sleep related nonobstructive alveolar hypoventilation: Secondary | ICD-10-CM

## 2016-11-27 DIAGNOSIS — K219 Gastro-esophageal reflux disease without esophagitis: Secondary | ICD-10-CM

## 2016-11-27 DIAGNOSIS — G4733 Obstructive sleep apnea (adult) (pediatric): Secondary | ICD-10-CM | POA: Diagnosis not present

## 2016-11-27 DIAGNOSIS — J449 Chronic obstructive pulmonary disease, unspecified: Secondary | ICD-10-CM

## 2016-11-27 DIAGNOSIS — J479 Bronchiectasis, uncomplicated: Secondary | ICD-10-CM | POA: Diagnosis not present

## 2016-11-27 DIAGNOSIS — D839 Common variable immunodeficiency, unspecified: Secondary | ICD-10-CM | POA: Diagnosis not present

## 2016-11-27 DIAGNOSIS — J4550 Severe persistent asthma, uncomplicated: Secondary | ICD-10-CM | POA: Diagnosis not present

## 2016-11-27 MED ORDER — OMEPRAZOLE 40 MG PO CPDR: 40.0000 mg | DELAYED_RELEASE_CAPSULE | Freq: Two times a day (BID) | ORAL | 5 refills | Status: DC

## 2016-11-27 NOTE — Progress Notes (Signed)
Follow-up Note  Referring Provider: Rochel Brome, MD Primary Provider: Rochel Brome, MD Date of Office Visit: 11/27/2016  Subjective:   Isaac Walsh (DOB: 06/23/1966) is a 50 y.o. male who returns to the [facility] on 11/27/2016 in re-evaluation of the following:  HPI: Isaac Walsh returns to this clinic in reevaluation of his CVID treated with intravenous immunoglobulin, history of chronic pseudomonas respiratory tract infection, bronchiectasis, obstructive lung disease with component of asthma, and autoimmune lymphopenia and reflux and untreated sleep apnea. I've not seen him in his clinic since May 2017.  He has only required one antibiotic for an episode of "bronchitis" during the interval. Otherwise, he feels as though his upper airway and his lower airway or doing very well. He does not use a short acting bronchodilator. He can exercise to the degree that he desires without any problem.  It does appear as though most of his improvement in his respiratory tract came about when he moved out of his old dusty living space. He's now in a cleaner environment.  His reflux has been active. Apparently the use of ranitidine was resulting in a positive urine test for methamphetamine when he went to his pain clinic and he had to discontinue this medication and presently he splits his omeprazole into 2 doses. Unfortunately, this has not resulted in good control of his reflux and he still has significant reflux events with regurgitation and intermittent chest pain.  He is not using oxygen at nighttime at this point in time. He does not use a CPAP machine. He believes that his breathing is doing much better and this is allowing him to sleep much better. He is not as tired throughout the day time period.  Malikiah did receive the flu vaccine.    Medication List      acyclovir 400 MG tablet Commonly known as:  ZOVIRAX Take 400 mg by mouth 5 (five) times daily. As  needed   AMBIEN 10 MG tablet Generic drug:  zolpidem Take 10 mg by mouth at bedtime.   carisoprodol 350 MG tablet Commonly known as:  SOMA Take 350 mg by mouth every 6 (six) hours as needed for muscle spasms.   fenofibrate micronized 134 MG capsule Commonly known as:  LOFIBRA Take 134 mg by mouth daily.   finasteride 1 MG tablet Commonly known as:  PROPECIA Take 1 mg by mouth daily.   HEMATINIC PLUS COMPLEX 106-1 MG Tabs Take 1 capsule by mouth daily.   Immune Globulin 10% 5 GM/50ML Soln Commonly known as:  PRIVIGEN Inject 50 g into the vein every 30 (thirty) days.   ipratropium-albuterol 0.5-2.5 (3) MG/3ML Soln Commonly known as:  DUONEB Take 3 mLs by nebulization every 6 (six) hours as needed.   levothyroxine 100 MCG tablet Commonly known as:  SYNTHROID, LEVOTHROID Take 100 mcg by mouth daily before breakfast.   montelukast 10 MG tablet Commonly known as:  SINGULAIR Take 10 mg by mouth at bedtime.   nitroGLYCERIN 0.4 MG SL tablet Commonly known as:  NITROSTAT Place 0.4 mg under the tongue as needed.   omeprazole 20 MG capsule Commonly known as:  PRILOSEC Take 20 mg by mouth 2 (two) times daily before a meal.   XTAMPZA ER 9 MG C12a Generic drug:  OxyCODONE ER TAKE ONE CAPSULE BY MOUTH EVERY 12 HOURS   OxyCODONE ER 13.5 MG C12a Take 13.5 mg by mouth.   oxyCODONE-acetaminophen 10-325 MG tablet Commonly known as:  PERCOCET Take by mouth.  ranitidine 300 MG tablet Commonly known as:  ZANTAC TAKE ONE TABLET BY MOUTH AT BEDTIME. MUST MAKE APPOINTMENT   sertraline 100 MG tablet Commonly known as:  ZOLOFT Take 100 mg by mouth. Take 1 1/2 tablets by mouth   SYMBICORT 160-4.5 MCG/ACT inhaler Generic drug:  budesonide-formoterol INHALE TWO PUFFS BY MOUTH TWICE DAILY INTO THE LUNGS WITH  SPACER   SYSTANE OP Apply to eye every 4 (four) hours.   traZODone 100 MG tablet Commonly known as:  DESYREL Take 200 mg by mouth at bedtime.   VENTOLIN HFA 108 (90  Base) MCG/ACT inhaler Generic drug:  albuterol Inhale 2 puffs into the lungs every 4 (four) hours as needed for wheezing or shortness of breath.       Past Medical History:  Diagnosis Date  . Abnormal heart rhythm   . Asthma   . Chronic pain syndrome   . Common variable immunodeficiency ([place])   . 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 COPD (chronic obstructive pulmonary disease) ([place])   . Depression with anxiety   . Fatigue   . GERD (gastroesophageal reflux disease)   . Hypercholesterolemia   . Hypothyroidism   . Lymphoma ([place])    as a child  . Nonalcoholic fatty liver disease   . OSA (obstructive sleep apnea)   . Osgood-Schlatter's disease   . Other nonspecific abnormal finding of lung field   . Plantar fasciitis   . Renal stones   . Sciatica   . SCID (severe combined immunodeficiency disease) ([place])   . Trigger finger   . Unspecified deficiency anemia   . Xerostomia     Past Surgical History:  Procedure Laterality Date  . LIVER SURGERY     10% of liver removed  . LYMPHADENECTOMY    . sinusectomy    . TYMPANOSTOMY TUBE PLACEMENT      Allergies  Allergen Reactions  . Androgel Pump [Testosterone]     rash  . Avelox [Moxifloxacin]     Blisters and "doesn't work for me" per pt  . Ceclor [Cefaclor]   . Celestone [Betamethasone]   . Cephalosporins Hives  . Claritin [Loratadine]   . Corticosteroids Hives  . Effexor [Venlafaxine]   . Flumist [Flu Virus Vaccine]     Pt reports he can tolerate flu shot but not mist  . Ketorolac   . Naprosyn [Naproxen]   . Other   . Quinolones Hives  . Tizanidine   . Toradol [Ketorolac Tromethamine]   . Vicodin [Hydrocodone-Acetaminophen]   . Rocephin [Ceftriaxone] Hives and Rash    Review of systems negative except as noted in HPI / PMHx or noted below:  Review of Systems  Constitutional: Negative.   HENT: Negative.   Eyes: Negative.   Respiratory: Negative.   Cardiovascular: Negative.   Gastrointestinal: Negative.    Genitourinary: Negative.   Musculoskeletal: Negative.   Skin: Negative.   Neurological: Negative.   Endo/Heme/Allergies: Negative.   Psychiatric/Behavioral: Negative.      Objective:   Vitals:   11/27/16 1346  BP: 112/82  Pulse: 80  Resp: 16          Physical Exam  Constitutional: He is well-developed, well-nourished, and in no distress.  HENT:  Head: Normocephalic.  Right Ear: External ear and ear canal normal. Tympanic membrane is scarred.  Left Ear: External ear and ear canal normal. Tympanic membrane is scarred.  Nose: Nose normal. No mucosal edema (septal perforation) or rhinorrhea.  Mouth/Throat: Uvula is midline, oropharynx is clear and moist  and mucous membranes are normal. No oropharyngeal exudate.  Eyes: Conjunctivae are normal.  Neck: Trachea normal. No tracheal tenderness present. No tracheal deviation present. No thyromegaly present.  Cardiovascular: Normal rate, regular rhythm, S1 normal, S2 normal and normal heart sounds.   No murmur heard. Pulmonary/Chest: Breath sounds normal. No stridor. No respiratory distress. He has no wheezes. He has no rales.  Musculoskeletal: He exhibits no edema.  Lymphadenopathy:       Head (right side): No tonsillar adenopathy present.       Head (left side): No tonsillar adenopathy present.    He has no cervical adenopathy.  Neurological: He is alert. Gait normal.  Skin: No rash noted. He is not diaphoretic. No erythema. Nails show no clubbing.  Psychiatric: Mood and affect normal.    Diagnostics:    Spirometry was performed and demonstrated an FEV1 of 2.20 at 64 % of predicted.  The patient had an Asthma Control Test with the following results: ACT Total Score: 23.    Assessment and Plan:   1. CVID (common variable immunodeficiency) ([place])   2. Severe persistent asthma, unspecified whether complicated   3. Chronic intermittent hypoxia with obstructive sleep apnea   4. Gastroesophageal reflux disease, esophagitis  presence not specified   5. Bronchiectasis without complication ([place])   6. COPD with asthma ([place])     1. Continue Privagen infusions   2. Continue Symbicort 160 two inhalations two times per day.   3. Add Qvar 80 two puffs twice a day to Symbicort during 'flare up'  3. Continue Montelukast 57m one tablet one time per day   4. Increase Omeprazole 40 in twice a day  5. Continue oxygen at night time  6. Use bronchodilator (Duoneb) or Proair Respiclick or similar 2 puffs every 4-6 hours if needed.  7. Return in six months.  CLedgerappears to be doing relatively well regarding all of his issues and we will continue to have him use intravenous immunoglobulin infusion as well as anti-inflammatory medications for his respiratory tract. I've also increase his dose of omeprazole to help with his reflux and reflux-induced chest pain. I will see him back in this clinic in approximately 6 months or earlier if there is a problem.  EAllena Katz MD [facility]

## 2016-11-27 NOTE — Patient Instructions (Signed)
  1. Continue Privagen infusions   2. Continue Symbicort 160 two inhalations two times per day.   3. Add Qvar 80 two puffs twice a day to Symbicort during 'flare up'  3. Continue Montelukast 62m one tablet one time per day   4. Increase Omeprazole 40 in twice a day  5. Continue oxygen at night time  6. Use bronchodilator (Duoneb) or Proair Respiclick (Coup[place]) 2 puffs every 4-6 hours if needed.  7. Return in six months.

## 2016-12-04 DIAGNOSIS — J060 Acute laryngopharyngitis: Secondary | ICD-10-CM | POA: Diagnosis not present

## 2016-12-04 DIAGNOSIS — Z125 Encounter for screening for malignant neoplasm of prostate: Secondary | ICD-10-CM | POA: Diagnosis not present

## 2016-12-04 DIAGNOSIS — Z0001 Encounter for general adult medical examination with abnormal findings: Secondary | ICD-10-CM | POA: Diagnosis not present

## 2016-12-04 DIAGNOSIS — Z6831 Body mass index (BMI) 31.0-31.9, adult: Secondary | ICD-10-CM | POA: Diagnosis not present

## 2016-12-04 DIAGNOSIS — R799 Abnormal finding of blood chemistry, unspecified: Secondary | ICD-10-CM | POA: Diagnosis not present

## 2016-12-04 DIAGNOSIS — E038 Other specified hypothyroidism: Secondary | ICD-10-CM | POA: Diagnosis not present

## 2016-12-04 DIAGNOSIS — Z1211 Encounter for screening for malignant neoplasm of colon: Secondary | ICD-10-CM | POA: Diagnosis not present

## 2016-12-04 DIAGNOSIS — E782 Mixed hyperlipidemia: Secondary | ICD-10-CM | POA: Diagnosis not present

## 2016-12-08 DIAGNOSIS — D839 Common variable immunodeficiency, unspecified: Secondary | ICD-10-CM | POA: Diagnosis not present

## 2016-12-15 DIAGNOSIS — Z1211 Encounter for screening for malignant neoplasm of colon: Secondary | ICD-10-CM | POA: Diagnosis not present

## 2016-12-18 DIAGNOSIS — M542 Cervicalgia: Secondary | ICD-10-CM | POA: Diagnosis not present

## 2016-12-18 DIAGNOSIS — J449 Chronic obstructive pulmonary disease, unspecified: Secondary | ICD-10-CM | POA: Diagnosis not present

## 2016-12-25 DIAGNOSIS — G894 Chronic pain syndrome: Secondary | ICD-10-CM | POA: Diagnosis not present

## 2016-12-25 DIAGNOSIS — M546 Pain in thoracic spine: Secondary | ICD-10-CM | POA: Diagnosis not present

## 2016-12-25 DIAGNOSIS — M150 Primary generalized (osteo)arthritis: Secondary | ICD-10-CM | POA: Diagnosis not present

## 2016-12-25 DIAGNOSIS — G8929 Other chronic pain: Secondary | ICD-10-CM | POA: Diagnosis not present

## 2016-12-25 DIAGNOSIS — M5440 Lumbago with sciatica, unspecified side: Secondary | ICD-10-CM | POA: Diagnosis not present

## 2016-12-26 DIAGNOSIS — M47814 Spondylosis without myelopathy or radiculopathy, thoracic region: Secondary | ICD-10-CM | POA: Diagnosis not present

## 2016-12-26 DIAGNOSIS — M546 Pain in thoracic spine: Secondary | ICD-10-CM | POA: Diagnosis not present

## 2016-12-26 DIAGNOSIS — M2578 Osteophyte, vertebrae: Secondary | ICD-10-CM | POA: Diagnosis not present

## 2016-12-31 ENCOUNTER — Other Ambulatory Visit: Payer: Self-pay | Admitting: Allergy and Immunology

## 2017-01-05 DIAGNOSIS — D839 Common variable immunodeficiency, unspecified: Secondary | ICD-10-CM | POA: Diagnosis not present

## 2017-01-18 DIAGNOSIS — J449 Chronic obstructive pulmonary disease, unspecified: Secondary | ICD-10-CM | POA: Diagnosis not present

## 2017-01-18 DIAGNOSIS — M542 Cervicalgia: Secondary | ICD-10-CM | POA: Diagnosis not present

## 2017-01-27 DIAGNOSIS — L738 Other specified follicular disorders: Secondary | ICD-10-CM | POA: Diagnosis not present

## 2017-01-27 DIAGNOSIS — J00 Acute nasopharyngitis [common cold]: Secondary | ICD-10-CM | POA: Diagnosis not present

## 2017-01-27 DIAGNOSIS — M65332 Trigger finger, left middle finger: Secondary | ICD-10-CM | POA: Diagnosis not present

## 2017-01-27 DIAGNOSIS — R5383 Other fatigue: Secondary | ICD-10-CM | POA: Diagnosis not present

## 2017-02-02 DIAGNOSIS — D839 Common variable immunodeficiency, unspecified: Secondary | ICD-10-CM | POA: Diagnosis not present

## 2017-02-17 DIAGNOSIS — M545 Low back pain: Secondary | ICD-10-CM | POA: Diagnosis not present

## 2017-02-17 DIAGNOSIS — R1012 Left upper quadrant pain: Secondary | ICD-10-CM | POA: Diagnosis not present

## 2017-02-17 DIAGNOSIS — K5901 Slow transit constipation: Secondary | ICD-10-CM | POA: Diagnosis not present

## 2017-02-18 DIAGNOSIS — J449 Chronic obstructive pulmonary disease, unspecified: Secondary | ICD-10-CM | POA: Diagnosis not present

## 2017-02-18 DIAGNOSIS — M542 Cervicalgia: Secondary | ICD-10-CM | POA: Diagnosis not present

## 2017-02-20 DIAGNOSIS — G894 Chronic pain syndrome: Secondary | ICD-10-CM | POA: Diagnosis not present

## 2017-02-20 DIAGNOSIS — G8929 Other chronic pain: Secondary | ICD-10-CM | POA: Diagnosis not present

## 2017-02-20 DIAGNOSIS — M150 Primary generalized (osteo)arthritis: Secondary | ICD-10-CM | POA: Diagnosis not present

## 2017-02-20 DIAGNOSIS — M5440 Lumbago with sciatica, unspecified side: Secondary | ICD-10-CM | POA: Diagnosis not present

## 2017-03-02 DIAGNOSIS — D839 Common variable immunodeficiency, unspecified: Secondary | ICD-10-CM | POA: Diagnosis not present

## 2017-03-04 DIAGNOSIS — R1012 Left upper quadrant pain: Secondary | ICD-10-CM | POA: Diagnosis not present

## 2017-03-09 ENCOUNTER — Other Ambulatory Visit: Payer: Self-pay | Admitting: Allergy and Immunology

## 2017-03-10 ENCOUNTER — Other Ambulatory Visit: Payer: Self-pay | Admitting: *Deleted

## 2017-03-10 MED ORDER — BUDESONIDE-FORMOTEROL FUMARATE 160-4.5 MCG/ACT IN AERO: 2.0000 | INHALATION_SPRAY | Freq: Two times a day (BID) | RESPIRATORY_TRACT | 0 refills | Status: DC

## 2017-03-16 DIAGNOSIS — M9902 Segmental and somatic dysfunction of thoracic region: Secondary | ICD-10-CM | POA: Diagnosis not present

## 2017-03-16 DIAGNOSIS — M9901 Segmental and somatic dysfunction of cervical region: Secondary | ICD-10-CM | POA: Diagnosis not present

## 2017-03-16 DIAGNOSIS — M545 Low back pain: Secondary | ICD-10-CM | POA: Diagnosis not present

## 2017-03-16 DIAGNOSIS — J158 Pneumonia due to other specified bacteria: Secondary | ICD-10-CM | POA: Diagnosis not present

## 2017-03-16 DIAGNOSIS — M9903 Segmental and somatic dysfunction of lumbar region: Secondary | ICD-10-CM | POA: Diagnosis not present

## 2017-03-16 DIAGNOSIS — M546 Pain in thoracic spine: Secondary | ICD-10-CM | POA: Diagnosis not present

## 2017-03-16 DIAGNOSIS — R1012 Left upper quadrant pain: Secondary | ICD-10-CM | POA: Diagnosis not present

## 2017-03-16 DIAGNOSIS — G44209 Tension-type headache, unspecified, not intractable: Secondary | ICD-10-CM | POA: Diagnosis not present

## 2017-03-17 DIAGNOSIS — E785 Hyperlipidemia, unspecified: Secondary | ICD-10-CM | POA: Diagnosis not present

## 2017-03-17 DIAGNOSIS — R072 Precordial pain: Secondary | ICD-10-CM | POA: Diagnosis not present

## 2017-03-17 DIAGNOSIS — G4733 Obstructive sleep apnea (adult) (pediatric): Secondary | ICD-10-CM | POA: Diagnosis not present

## 2017-03-17 DIAGNOSIS — I10 Essential (primary) hypertension: Secondary | ICD-10-CM | POA: Diagnosis not present

## 2017-03-17 DIAGNOSIS — I209 Angina pectoris, unspecified: Secondary | ICD-10-CM | POA: Diagnosis not present

## 2017-03-18 DIAGNOSIS — J449 Chronic obstructive pulmonary disease, unspecified: Secondary | ICD-10-CM | POA: Diagnosis not present

## 2017-03-18 DIAGNOSIS — M542 Cervicalgia: Secondary | ICD-10-CM | POA: Diagnosis not present

## 2017-03-30 DIAGNOSIS — D839 Common variable immunodeficiency, unspecified: Secondary | ICD-10-CM | POA: Diagnosis not present

## 2017-04-17 DIAGNOSIS — M5440 Lumbago with sciatica, unspecified side: Secondary | ICD-10-CM | POA: Diagnosis not present

## 2017-04-17 DIAGNOSIS — G8929 Other chronic pain: Secondary | ICD-10-CM | POA: Diagnosis not present

## 2017-04-17 DIAGNOSIS — M150 Primary generalized (osteo)arthritis: Secondary | ICD-10-CM | POA: Diagnosis not present

## 2017-04-17 DIAGNOSIS — G894 Chronic pain syndrome: Secondary | ICD-10-CM | POA: Diagnosis not present

## 2017-04-18 DIAGNOSIS — J449 Chronic obstructive pulmonary disease, unspecified: Secondary | ICD-10-CM | POA: Diagnosis not present

## 2017-04-18 DIAGNOSIS — M542 Cervicalgia: Secondary | ICD-10-CM | POA: Diagnosis not present

## 2017-04-23 DIAGNOSIS — K5900 Constipation, unspecified: Secondary | ICD-10-CM | POA: Diagnosis not present

## 2017-04-27 DIAGNOSIS — D839 Common variable immunodeficiency, unspecified: Secondary | ICD-10-CM | POA: Diagnosis not present

## 2017-05-18 DIAGNOSIS — M542 Cervicalgia: Secondary | ICD-10-CM | POA: Diagnosis not present

## 2017-05-18 DIAGNOSIS — J449 Chronic obstructive pulmonary disease, unspecified: Secondary | ICD-10-CM | POA: Diagnosis not present

## 2017-05-25 DIAGNOSIS — D839 Common variable immunodeficiency, unspecified: Secondary | ICD-10-CM | POA: Diagnosis not present

## 2017-05-27 ENCOUNTER — Encounter: Payer: Self-pay | Admitting: Allergy and Immunology

## 2017-05-27 ENCOUNTER — Ambulatory Visit (INDEPENDENT_AMBULATORY_CARE_PROVIDER_SITE_OTHER): Payer: Medicare HMO | Admitting: Allergy and Immunology

## 2017-05-27 VITALS — BP 130/78 | HR 82 | Resp 16

## 2017-05-27 DIAGNOSIS — J4550 Severe persistent asthma, uncomplicated: Secondary | ICD-10-CM | POA: Diagnosis not present

## 2017-05-27 DIAGNOSIS — G4733 Obstructive sleep apnea (adult) (pediatric): Secondary | ICD-10-CM

## 2017-05-27 DIAGNOSIS — G4734 Idiopathic sleep related nonobstructive alveolar hypoventilation: Secondary | ICD-10-CM

## 2017-05-27 DIAGNOSIS — J479 Bronchiectasis, uncomplicated: Secondary | ICD-10-CM | POA: Diagnosis not present

## 2017-05-27 DIAGNOSIS — D839 Common variable immunodeficiency, unspecified: Secondary | ICD-10-CM | POA: Diagnosis not present

## 2017-05-27 DIAGNOSIS — K219 Gastro-esophageal reflux disease without esophagitis: Secondary | ICD-10-CM | POA: Diagnosis not present

## 2017-05-27 MED ORDER — METHYLPREDNISOLONE ACETATE 80 MG/ML IJ SUSP
80.0000 mg | Freq: Once | INTRAMUSCULAR | Status: AC
  Administered 2017-05-27: 80 mg via INTRAMUSCULAR

## 2017-05-27 MED ORDER — ALBUTEROL SULFATE HFA 108 (90 BASE) MCG/ACT IN AERS: 2.0000 | INHALATION_SPRAY | RESPIRATORY_TRACT | 1 refills | Status: DC | PRN

## 2017-05-27 MED ORDER — ALBUTEROL SULFATE 108 (90 BASE) MCG/ACT IN AEPB: 2.0000 | INHALATION_SPRAY | RESPIRATORY_TRACT | 1 refills | Status: DC | PRN

## 2017-05-27 NOTE — Progress Notes (Signed)
Follow-up Note  Referring Provider: Rochel Brome, MD Primary Provider: Rochel Brome, MD Date of Office Visit: 05/27/2017  Subjective:   Isaac Walsh (DOB: [birth year]) is a 51 y.o. male who returns to the [facility] on 05/27/2017 in re-evaluation of the following:  HPI: [name] returns to this clinic in reevaluation of his CVID treated with intravenous immunoglobulin infusions using Privigen 50 g every 4 weeks, history of chronic pseudomonas respiratory tract infection, bronchiectasis, obstructive lung disease with component of asthma, autoimmune lymphopenia, reflux, and untreated sleep apnea. His last visit to this clinic was November 2017.  Drevon informs me that he required an antibiotic for a "pneumonia" that occurred this winter. Apparently he had a CT scan of his chest in evaluation of left sided chest pain. He is not entirely sure if the CT scan showed any abnormality. As well, these past 5 days he has had a hacking cough with yellow sputum production initially which has changed to relatively clear sputum production. He is coughing so hard that he has pain with his ribs in a diffuse location. He has not had any high fever with this event. He does not have any associated upper airway symptoms with this event. He has not been having any reflux issues with this event. He uses DuoNeb nebulization about one or 2 times per day especially in the morning when his cough is the worst.  For the most part his reflux is under pretty good control. He was having problems with constipation for which he saw Dr. Lyndel Safe and there is a discussion about performing a colonoscopy.  He is not using his CPAP machine and has not done so for years. He intermittently uses his oxygen at nighttime averaging out to about 4 times per week.  His infusions are going well. He has not had any adverse effects secondary to the use treatments. He just received a 50 g Privigen infusion Monday  2 days ago.  Allergies as of 05/27/2017      Reactions   Corticosteroids Hives   Androgel Pump [testosterone]    rash   Avelox [moxifloxacin]    Blisters and "doesn't work for me" per pt   Ceclor [cefaclor]    Celestone [betamethasone]    Cephalosporins Hives   Claritin [loratadine]    Effexor [venlafaxine]    Flumist [flu Virus Vaccine]    Pt reports he can tolerate flu shot but not mist   Ketorolac    Naprosyn [naproxen]    Other    Quinolones Hives   Tizanidine    Toradol [ketorolac Tromethamine]    Rocephin [ceftriaxone] Hives, Rash   Vicodin [hydrocodone-acetaminophen] Nausea Only      Medication List      acyclovir 400 MG tablet Commonly known as:  ZOVIRAX Take 400 mg by mouth 5 (five) times daily. As needed   Albuterol Sulfate 108 (90 Base) MCG/ACT Aepb Commonly known as:  PROAIR RESPICLICK Inhale 2 puffs into the lungs every 4 (four) hours as needed.   AMBIEN 10 MG tablet Generic drug:  zolpidem Take 10 mg by mouth at bedtime.   budesonide-formoterol 160-4.5 MCG/ACT inhaler Commonly known as:  SYMBICORT Inhale 2 puffs into the lungs 2 (two) times daily.   carisoprodol 350 MG tablet Commonly known as:  SOMA Take 350 mg by mouth every 6 (six) hours as needed for muscle spasms.   fenofibrate micronized 134 MG capsule Commonly known as:  LOFIBRA Take 134 mg by mouth daily.  finasteride 1 MG tablet Commonly known as:  PROPECIA Take 1 mg by mouth daily.   HEMATINIC PLUS COMPLEX 106-1 MG Tabs Take 1 capsule by mouth daily.   Immune Globulin 10% 5 GM/50ML Soln Commonly known as:  PRIVIGEN Inject 50 g into the vein every 30 (thirty) days.   ipratropium-albuterol 0.5-2.5 (3) MG/3ML Soln Commonly known as:  DUONEB Take 3 mLs by nebulization every 6 (six) hours as needed.   levothyroxine 100 MCG tablet Commonly known as:  SYNTHROID, LEVOTHROID Take 100 mcg by mouth daily before breakfast.   montelukast 10 MG tablet Commonly known as:   SINGULAIR Take 10 mg by mouth at bedtime.   nitroGLYCERIN 0.4 MG SL tablet Commonly known as:  NITROSTAT Place 0.4 mg under the tongue as needed.   NORVASC PO Take 1 tablet by mouth daily.   omeprazole 40 MG capsule Commonly known as:  PRILOSEC Take 1 capsule (40 mg total) by mouth 2 (two) times daily.   oxyCODONE-acetaminophen 10-325 MG tablet Commonly known as:  PERCOCET Take by mouth.   RANEXA 1000 MG SR tablet Generic drug:  ranolazine Take 1,000 mg by mouth 2 (two) times daily.   sertraline 100 MG tablet Commonly known as:  ZOLOFT Take 100 mg by mouth. Take 1 1/2 tablets by mouth   SYSTANE OP Apply to eye every 4 (four) hours.   traZODone 100 MG tablet Commonly known as:  DESYREL Take 200 mg by mouth at bedtime.   XTAMPZA ER 9 MG C12a Generic drug:  OxyCODONE ER TAKE ONE CAPSULE BY MOUTH EVERY 12 HOURS       Past Medical History:  Diagnosis Date  . Abnormal heart rhythm   . Asthma   . Chronic pain syndrome   . Common variable immunodeficiency ([place])   . Congenital dysgammaglobulinemia ([place])   . COPD (chronic obstructive pulmonary disease) ([place])   . Depression with anxiety   . Fatigue   . GERD (gastroesophageal reflux disease)   . Hypercholesterolemia   . Hypothyroidism   . Lymphoma ([place])    as a child  . Nonalcoholic fatty liver disease   . OSA (obstructive sleep apnea)   . Osgood-Schlatter's disease   . Other nonspecific abnormal finding of lung field   . Plantar fasciitis   . Renal stones   . Sciatica   . SCID (severe combined immunodeficiency disease) ([place])   . Trigger finger   . Unspecified deficiency anemia   . Xerostomia     Past Surgical History:  Procedure Laterality Date  . LIVER SURGERY     10% of liver removed  . LYMPHADENECTOMY    . sinusectomy    . TYMPANOSTOMY TUBE PLACEMENT      Review of systems negative except as noted in HPI / PMHx or noted below:  Review of Systems  Constitutional: Negative.   HENT: Negative.    Eyes: Negative.   Respiratory: Negative.   Cardiovascular: Negative.   Gastrointestinal: Negative.   Genitourinary: Negative.   Musculoskeletal: Negative.   Skin: Negative.   Neurological: Negative.   Endo/Heme/Allergies: Negative.   Psychiatric/Behavioral: Negative.      Objective:   Vitals:   05/27/17 1344  BP: 130/78  Pulse: 82  Resp: 16          Physical Exam  Constitutional: He is well-developed, well-nourished, and in no distress.  HENT:  Head: Normocephalic.  Right Ear: External ear and ear canal normal. Tympanic membrane is scarred.  Left Ear: External ear and ear  canal normal. Tympanic membrane is scarred.  Nose: Nose normal. No mucosal edema or rhinorrhea.  Mouth/Throat: Uvula is midline, oropharynx is clear and moist and mucous membranes are normal. No oropharyngeal exudate.  Septal perforation  Eyes: Conjunctivae are normal.  Neck: Trachea normal. No tracheal tenderness present. No tracheal deviation present. No thyromegaly present.  Cardiovascular: Normal rate, regular rhythm, S1 normal, S2 normal and normal heart sounds.   No murmur heard. Pulmonary/Chest: No stridor. No respiratory distress. He has wheezes (bilateral inspiratory and expiratory wheezing all lung fields). He has no rales.  Musculoskeletal: He exhibits no edema.  Lymphadenopathy:       Head (right side): No tonsillar adenopathy present.       Head (left side): No tonsillar adenopathy present.    He has no cervical adenopathy.  Neurological: He is alert. Gait normal.  Skin: No rash noted. He is not diaphoretic. No erythema. Nails show no clubbing.  Psychiatric: Mood and affect normal.    Diagnostics: Oxygen saturation was 96% on room air at rest   Spirometry was performed and demonstrated an FEV1 of 1.95 at 57 % of predicted.  The patient had an Asthma Control Test with the following results: ACT Total Score: 10.    Assessment and Plan:   1. Severe persistent asthma, unspecified  whether complicated   2. CVID (common variable immunodeficiency) ([place])   3. Chronic intermittent hypoxia with obstructive sleep apnea   4. Bronchiectasis without complication ([place])   5. Gastroesophageal reflux disease, esophagitis presence not specified     1. Continue Privagen infusions. Increase dose to 60G every 4 weeks. Obtain next infusion in two weeks  2. Continue Symbicort 160 two inhalations two times per day.   3. Add Qvar 80 REDIHALER two puffs twice a day to Symbicort during 'flare up'  3. Continue Montelukast 14m one tablet one time per day   4. Continue Omeprazole 40 twice a day  5. Continue oxygen at night time  6. Use bronchodilator (Duoneb) or Proair Respiclick (Coupon) 2 puffs every 4-6 hours if needed.  7. Depo-Medrol 80 IM delivered in clinic today  8. Check IgG prior to second 60G infusion.  9. Return in six months or earlier if problem.  Given the fact that CAizikhad a recent "pneumonia" and has another respiratory tract event this week most likely infectious in etiology, I'm going to increase his immunologic globulin infusion by 15% with his next infusion and have him overlap his infusions with a early two week infusion.. I do not want to give him any additional antibiotics today as he has developed very significant chronic respiratory Pseudomonas infection as a result of recurrent steroids and antibiotic administration in the past. We will just see how things go with the steroid administration today. Fortunately, he did have his immunoglobulin infusion just 2 days ago. He will also continue to use anti-inflammatory medications for the inflammatory component of his disease date affecting his respiratory tract as noted above and also continue to treat reflux with treatment noted above. He will keep in contact with me noting his response to this approach as we move forward. Further evaluation and treatment will be based upon this response.  EAllena Katz  MD Allergy / Immunology [facility]

## 2017-05-27 NOTE — Patient Instructions (Addendum)
  1. Continue Privagen infusions. Increase dose to 60G every 4 weeks. Obtain next infusion in two weeks  2. Continue Symbicort 160 two inhalations two times per day.   3. Add Qvar 80 REDIHALER two puffs twice a day to Symbicort during 'flare up'  3. Continue Montelukast 66m one tablet one time per day   4. Continue Omeprazole 40 twice a day  5. Continue oxygen at night time  6. Use bronchodilator (Duoneb) or Proair Respiclick (Coupon) 2 puffs every 4-6 hours if needed.  7. Depo-Medrol 80 IM delivered in clinic today  8. Check IgG prior to second 60G infusion.  9. Return in six months or earlier if problem.

## 2017-06-08 DIAGNOSIS — D839 Common variable immunodeficiency, unspecified: Secondary | ICD-10-CM | POA: Diagnosis not present

## 2017-06-10 ENCOUNTER — Telehealth: Payer: Self-pay | Admitting: *Deleted

## 2017-06-10 MED ORDER — AMOXICILLIN-POT CLAVULANATE 875-125 MG PO TABS: 1.0000 | ORAL_TABLET | Freq: Two times a day (BID) | ORAL | 0 refills | Status: AC

## 2017-06-10 NOTE — Addendum Note (Signed)
Addended by: Carin Hock on: 06/10/2017 11:19 AM   Modules accepted: Orders

## 2017-06-10 NOTE — Telephone Encounter (Signed)
Patient advised that still coughing not a lot of improvement since office visit. Wondering if he needs to go on antibiotic.  He got his new increased Privigin infusion on Monday states he is still feeling fatigued  from new dose usually just feels that way the day after infusion. Advised him it may take a couple extra days for his body to get used to new dose.

## 2017-06-10 NOTE — Telephone Encounter (Signed)
Please have him take Augmentin 875 one tablet two times per day for 14 days. Can he take Augmentin without problems in the past?

## 2017-06-10 NOTE — Telephone Encounter (Signed)
Called and advised patient of Rx he stated he did not think he has problem with Augmentin just Avelox. Rx sent to [facility]

## 2017-06-16 DIAGNOSIS — M546 Pain in thoracic spine: Secondary | ICD-10-CM | POA: Diagnosis not present

## 2017-06-16 DIAGNOSIS — G8929 Other chronic pain: Secondary | ICD-10-CM | POA: Diagnosis not present

## 2017-06-16 DIAGNOSIS — Z79891 Long term (current) use of opiate analgesic: Secondary | ICD-10-CM | POA: Diagnosis not present

## 2017-06-16 DIAGNOSIS — M5440 Lumbago with sciatica, unspecified side: Secondary | ICD-10-CM | POA: Diagnosis not present

## 2017-06-16 DIAGNOSIS — G894 Chronic pain syndrome: Secondary | ICD-10-CM | POA: Diagnosis not present

## 2017-06-16 DIAGNOSIS — Z5181 Encounter for therapeutic drug level monitoring: Secondary | ICD-10-CM | POA: Diagnosis not present

## 2017-06-16 DIAGNOSIS — M150 Primary generalized (osteo)arthritis: Secondary | ICD-10-CM | POA: Diagnosis not present

## 2017-06-18 DIAGNOSIS — J449 Chronic obstructive pulmonary disease, unspecified: Secondary | ICD-10-CM | POA: Diagnosis not present

## 2017-06-18 DIAGNOSIS — M542 Cervicalgia: Secondary | ICD-10-CM | POA: Diagnosis not present

## 2017-06-21 ENCOUNTER — Other Ambulatory Visit: Payer: Self-pay | Admitting: Allergy and Immunology

## 2017-06-22 ENCOUNTER — Telehealth: Payer: Self-pay | Admitting: *Deleted

## 2017-06-22 NOTE — Telephone Encounter (Signed)
Patient called and advised has 2 days left of Augmentin and still no better. Still having cough and yellow sputum and sinus symptoms

## 2017-06-22 NOTE — Telephone Encounter (Signed)
Called and advised patient to come by for order to take to [facility] for culture

## 2017-06-22 NOTE — Telephone Encounter (Signed)
These have patient obtain a sputum culture for bacteria and depending on what this shows we will give him the appropriate antibiotic.

## 2017-06-25 DIAGNOSIS — B999 Unspecified infectious disease: Secondary | ICD-10-CM | POA: Diagnosis not present

## 2017-06-25 DIAGNOSIS — D839 Common variable immunodeficiency, unspecified: Secondary | ICD-10-CM | POA: Diagnosis not present

## 2017-06-29 ENCOUNTER — Telehealth: Payer: Self-pay | Admitting: *Deleted

## 2017-06-29 NOTE — Telephone Encounter (Signed)
Patient called to find out if we have gotten his sputum culture results.  I advised him I would let Dr Neldon Mc see results and call him back.  He advised his cough has gotten worse and had miserable weekend

## 2017-06-29 NOTE — Telephone Encounter (Signed)
Imaging on your desk please advise

## 2017-06-29 NOTE — Telephone Encounter (Signed)
Patient advised of instructions will fax order to [facility]

## 2017-06-29 NOTE — Telephone Encounter (Signed)
please have him obtain a chest x-ray

## 2017-06-29 NOTE — Telephone Encounter (Signed)
Please have him obtain a chest x-ray. As well, please obtain all previous imaging studies of his chest 2018

## 2017-06-30 ENCOUNTER — Telehealth: Payer: Self-pay | Admitting: *Deleted

## 2017-06-30 DIAGNOSIS — R0602 Shortness of breath: Secondary | ICD-10-CM | POA: Diagnosis not present

## 2017-06-30 DIAGNOSIS — R05 Cough: Secondary | ICD-10-CM | POA: Diagnosis not present

## 2017-06-30 MED ORDER — LEVOFLOXACIN 750 MG PO TABS: 750.0000 mg | ORAL_TABLET | Freq: Every day | ORAL | 0 refills | Status: AC

## 2017-06-30 NOTE — Telephone Encounter (Signed)
Called patient and advised results of chest xray pneumonia per Dr Neldon Mc rx Levaquin 750 for 14 days. rx sent

## 2017-07-02 ENCOUNTER — Other Ambulatory Visit: Payer: Self-pay | Admitting: Allergy and Immunology

## 2017-07-02 ENCOUNTER — Encounter: Payer: Self-pay | Admitting: *Deleted

## 2017-07-06 DIAGNOSIS — D839 Common variable immunodeficiency, unspecified: Secondary | ICD-10-CM | POA: Diagnosis not present

## 2017-07-08 DIAGNOSIS — J168 Pneumonia due to other specified infectious organisms: Secondary | ICD-10-CM | POA: Diagnosis not present

## 2017-07-08 DIAGNOSIS — D6489 Other specified anemias: Secondary | ICD-10-CM | POA: Diagnosis not present

## 2017-07-08 DIAGNOSIS — Z79899 Other long term (current) drug therapy: Secondary | ICD-10-CM | POA: Diagnosis not present

## 2017-07-08 DIAGNOSIS — F3131 Bipolar disorder, current episode depressed, mild: Secondary | ICD-10-CM | POA: Diagnosis not present

## 2017-07-08 DIAGNOSIS — J218 Acute bronchiolitis due to other specified organisms: Secondary | ICD-10-CM | POA: Diagnosis not present

## 2017-07-08 DIAGNOSIS — E782 Mixed hyperlipidemia: Secondary | ICD-10-CM | POA: Diagnosis not present

## 2017-07-18 DIAGNOSIS — J449 Chronic obstructive pulmonary disease, unspecified: Secondary | ICD-10-CM | POA: Diagnosis not present

## 2017-07-18 DIAGNOSIS — M542 Cervicalgia: Secondary | ICD-10-CM | POA: Diagnosis not present

## 2017-07-21 ENCOUNTER — Telehealth: Payer: Self-pay | Admitting: *Deleted

## 2017-07-21 DIAGNOSIS — Z1211 Encounter for screening for malignant neoplasm of colon: Secondary | ICD-10-CM | POA: Diagnosis not present

## 2017-07-21 NOTE — Telephone Encounter (Signed)
Dr Neldon Mc IgG still low from test done prior to July IVIG at [facility].  Will need to repeat prior to Aug infusion.  Order faxed to [facility] for test

## 2017-07-23 DIAGNOSIS — F3131 Bipolar disorder, current episode depressed, mild: Secondary | ICD-10-CM | POA: Diagnosis not present

## 2017-07-23 DIAGNOSIS — R7301 Impaired fasting glucose: Secondary | ICD-10-CM | POA: Diagnosis not present

## 2017-07-23 DIAGNOSIS — E782 Mixed hyperlipidemia: Secondary | ICD-10-CM | POA: Diagnosis not present

## 2017-07-23 DIAGNOSIS — D508 Other iron deficiency anemias: Secondary | ICD-10-CM | POA: Diagnosis not present

## 2017-07-24 ENCOUNTER — Encounter: Payer: Self-pay | Admitting: *Deleted

## 2017-08-03 DIAGNOSIS — D839 Common variable immunodeficiency, unspecified: Secondary | ICD-10-CM | POA: Diagnosis not present

## 2017-08-04 DIAGNOSIS — R05 Cough: Secondary | ICD-10-CM | POA: Diagnosis not present

## 2017-08-04 DIAGNOSIS — J3089 Other allergic rhinitis: Secondary | ICD-10-CM | POA: Diagnosis not present

## 2017-08-10 ENCOUNTER — Other Ambulatory Visit: Payer: Self-pay | Admitting: *Deleted

## 2017-08-10 ENCOUNTER — Telehealth: Payer: Self-pay | Admitting: *Deleted

## 2017-08-10 NOTE — Telephone Encounter (Signed)
L/M for patient to contact office.  Per recent labs IgG still not where is should be per Dr Neldon Mc.  Need to Increase IVIG from 60 grams every 4 weeks to 70 grams every 4 weeks

## 2017-08-10 NOTE — Telephone Encounter (Signed)
Also need to recheck IgG level after 2 infusions and prior to 3 rd new infusion dose

## 2017-08-10 NOTE — Telephone Encounter (Signed)
T/C from patient advised Dr Bruna Potter instructions increased dosage and redraw IgG.  Will send new orders to [facility]

## 2017-08-14 DIAGNOSIS — G894 Chronic pain syndrome: Secondary | ICD-10-CM | POA: Diagnosis not present

## 2017-08-14 DIAGNOSIS — M546 Pain in thoracic spine: Secondary | ICD-10-CM | POA: Diagnosis not present

## 2017-08-14 DIAGNOSIS — M150 Primary generalized (osteo)arthritis: Secondary | ICD-10-CM | POA: Diagnosis not present

## 2017-08-14 DIAGNOSIS — M5440 Lumbago with sciatica, unspecified side: Secondary | ICD-10-CM | POA: Diagnosis not present

## 2017-08-14 DIAGNOSIS — G8929 Other chronic pain: Secondary | ICD-10-CM | POA: Diagnosis not present

## 2017-08-18 DIAGNOSIS — M542 Cervicalgia: Secondary | ICD-10-CM | POA: Diagnosis not present

## 2017-08-18 DIAGNOSIS — J449 Chronic obstructive pulmonary disease, unspecified: Secondary | ICD-10-CM | POA: Diagnosis not present

## 2017-09-02 DIAGNOSIS — D839 Common variable immunodeficiency, unspecified: Secondary | ICD-10-CM | POA: Diagnosis not present

## 2017-09-18 DIAGNOSIS — M542 Cervicalgia: Secondary | ICD-10-CM | POA: Diagnosis not present

## 2017-09-18 DIAGNOSIS — J449 Chronic obstructive pulmonary disease, unspecified: Secondary | ICD-10-CM | POA: Diagnosis not present

## 2017-09-21 DIAGNOSIS — R51 Headache: Secondary | ICD-10-CM | POA: Diagnosis not present

## 2017-09-21 DIAGNOSIS — J209 Acute bronchitis, unspecified: Secondary | ICD-10-CM | POA: Diagnosis not present

## 2017-09-21 DIAGNOSIS — M549 Dorsalgia, unspecified: Secondary | ICD-10-CM | POA: Diagnosis not present

## 2017-09-21 DIAGNOSIS — D509 Iron deficiency anemia, unspecified: Secondary | ICD-10-CM | POA: Diagnosis not present

## 2017-09-21 DIAGNOSIS — J189 Pneumonia, unspecified organism: Secondary | ICD-10-CM | POA: Diagnosis not present

## 2017-09-21 DIAGNOSIS — D802 Selective deficiency of immunoglobulin A [IgA]: Secondary | ICD-10-CM | POA: Diagnosis not present

## 2017-09-21 DIAGNOSIS — D839 Common variable immunodeficiency, unspecified: Secondary | ICD-10-CM | POA: Diagnosis not present

## 2017-09-24 DIAGNOSIS — F5101 Primary insomnia: Secondary | ICD-10-CM | POA: Diagnosis not present

## 2017-09-24 DIAGNOSIS — M65322 Trigger finger, left index finger: Secondary | ICD-10-CM | POA: Diagnosis not present

## 2017-09-28 DIAGNOSIS — D839 Common variable immunodeficiency, unspecified: Secondary | ICD-10-CM | POA: Diagnosis not present

## 2017-10-15 DIAGNOSIS — J018 Other acute sinusitis: Secondary | ICD-10-CM | POA: Diagnosis not present

## 2017-10-16 DIAGNOSIS — M150 Primary generalized (osteo)arthritis: Secondary | ICD-10-CM | POA: Diagnosis not present

## 2017-10-16 DIAGNOSIS — M546 Pain in thoracic spine: Secondary | ICD-10-CM | POA: Diagnosis not present

## 2017-10-16 DIAGNOSIS — G8929 Other chronic pain: Secondary | ICD-10-CM | POA: Diagnosis not present

## 2017-10-16 DIAGNOSIS — G894 Chronic pain syndrome: Secondary | ICD-10-CM | POA: Diagnosis not present

## 2017-10-16 DIAGNOSIS — M5440 Lumbago with sciatica, unspecified side: Secondary | ICD-10-CM | POA: Diagnosis not present

## 2017-10-18 DIAGNOSIS — J449 Chronic obstructive pulmonary disease, unspecified: Secondary | ICD-10-CM | POA: Diagnosis not present

## 2017-10-26 DIAGNOSIS — D839 Common variable immunodeficiency, unspecified: Secondary | ICD-10-CM | POA: Diagnosis not present

## 2017-11-04 DIAGNOSIS — D508 Other iron deficiency anemias: Secondary | ICD-10-CM | POA: Diagnosis not present

## 2017-11-04 DIAGNOSIS — Z23 Encounter for immunization: Secondary | ICD-10-CM | POA: Diagnosis not present

## 2017-11-04 DIAGNOSIS — R7301 Impaired fasting glucose: Secondary | ICD-10-CM | POA: Diagnosis not present

## 2017-11-04 DIAGNOSIS — E782 Mixed hyperlipidemia: Secondary | ICD-10-CM | POA: Diagnosis not present

## 2017-11-04 DIAGNOSIS — D838 Other common variable immunodeficiencies: Secondary | ICD-10-CM | POA: Diagnosis not present

## 2017-11-04 DIAGNOSIS — E034 Atrophy of thyroid (acquired): Secondary | ICD-10-CM | POA: Diagnosis not present

## 2017-11-04 DIAGNOSIS — F3131 Bipolar disorder, current episode depressed, mild: Secondary | ICD-10-CM | POA: Diagnosis not present

## 2017-11-09 DIAGNOSIS — M489 Spondylopathy, unspecified: Secondary | ICD-10-CM | POA: Diagnosis not present

## 2017-11-09 DIAGNOSIS — M546 Pain in thoracic spine: Secondary | ICD-10-CM | POA: Diagnosis not present

## 2017-11-09 DIAGNOSIS — M439 Deforming dorsopathy, unspecified: Secondary | ICD-10-CM | POA: Diagnosis not present

## 2017-11-10 DIAGNOSIS — D508 Other iron deficiency anemias: Secondary | ICD-10-CM | POA: Diagnosis not present

## 2017-11-10 DIAGNOSIS — E034 Atrophy of thyroid (acquired): Secondary | ICD-10-CM | POA: Diagnosis not present

## 2017-11-10 DIAGNOSIS — E782 Mixed hyperlipidemia: Secondary | ICD-10-CM | POA: Diagnosis not present

## 2017-11-11 DIAGNOSIS — I10 Essential (primary) hypertension: Secondary | ICD-10-CM | POA: Diagnosis not present

## 2017-11-11 DIAGNOSIS — Z87891 Personal history of nicotine dependence: Secondary | ICD-10-CM | POA: Diagnosis not present

## 2017-11-11 DIAGNOSIS — E785 Hyperlipidemia, unspecified: Secondary | ICD-10-CM | POA: Diagnosis not present

## 2017-11-11 DIAGNOSIS — J449 Chronic obstructive pulmonary disease, unspecified: Secondary | ICD-10-CM | POA: Diagnosis not present

## 2017-11-11 DIAGNOSIS — I209 Angina pectoris, unspecified: Secondary | ICD-10-CM | POA: Diagnosis not present

## 2017-11-18 DIAGNOSIS — J449 Chronic obstructive pulmonary disease, unspecified: Secondary | ICD-10-CM | POA: Diagnosis not present

## 2017-11-22 ENCOUNTER — Other Ambulatory Visit: Payer: Self-pay | Admitting: Allergy and Immunology

## 2017-11-23 DIAGNOSIS — D839 Common variable immunodeficiency, unspecified: Secondary | ICD-10-CM | POA: Diagnosis not present

## 2017-12-02 DIAGNOSIS — M65332 Trigger finger, left middle finger: Secondary | ICD-10-CM | POA: Diagnosis not present

## 2017-12-02 DIAGNOSIS — J208 Acute bronchitis due to other specified organisms: Secondary | ICD-10-CM | POA: Diagnosis not present

## 2017-12-02 DIAGNOSIS — G5761 Lesion of plantar nerve, right lower limb: Secondary | ICD-10-CM | POA: Diagnosis not present

## 2017-12-04 DIAGNOSIS — M150 Primary generalized (osteo)arthritis: Secondary | ICD-10-CM | POA: Diagnosis not present

## 2017-12-04 DIAGNOSIS — M5416 Radiculopathy, lumbar region: Secondary | ICD-10-CM | POA: Diagnosis not present

## 2017-12-04 DIAGNOSIS — G8929 Other chronic pain: Secondary | ICD-10-CM | POA: Diagnosis not present

## 2017-12-04 DIAGNOSIS — G894 Chronic pain syndrome: Secondary | ICD-10-CM | POA: Diagnosis not present

## 2017-12-04 DIAGNOSIS — M5440 Lumbago with sciatica, unspecified side: Secondary | ICD-10-CM | POA: Diagnosis not present

## 2017-12-18 DIAGNOSIS — J449 Chronic obstructive pulmonary disease, unspecified: Secondary | ICD-10-CM | POA: Diagnosis not present

## 2017-12-23 ENCOUNTER — Other Ambulatory Visit: Payer: Self-pay | Admitting: Allergy and Immunology

## 2017-12-23 DIAGNOSIS — D839 Common variable immunodeficiency, unspecified: Secondary | ICD-10-CM | POA: Diagnosis not present

## 2018-01-06 DIAGNOSIS — M5416 Radiculopathy, lumbar region: Secondary | ICD-10-CM | POA: Diagnosis not present

## 2018-01-06 DIAGNOSIS — M546 Pain in thoracic spine: Secondary | ICD-10-CM | POA: Diagnosis not present

## 2018-01-06 DIAGNOSIS — G8929 Other chronic pain: Secondary | ICD-10-CM | POA: Diagnosis not present

## 2018-01-06 DIAGNOSIS — M4316 Spondylolisthesis, lumbar region: Secondary | ICD-10-CM | POA: Diagnosis not present

## 2018-01-06 DIAGNOSIS — M5440 Lumbago with sciatica, unspecified side: Secondary | ICD-10-CM | POA: Diagnosis not present

## 2018-01-06 DIAGNOSIS — R109 Unspecified abdominal pain: Secondary | ICD-10-CM | POA: Diagnosis not present

## 2018-01-06 DIAGNOSIS — G894 Chronic pain syndrome: Secondary | ICD-10-CM | POA: Diagnosis not present

## 2018-01-18 DIAGNOSIS — J449 Chronic obstructive pulmonary disease, unspecified: Secondary | ICD-10-CM | POA: Diagnosis not present

## 2018-01-20 ENCOUNTER — Other Ambulatory Visit: Payer: Self-pay | Admitting: Allergy and Immunology

## 2018-01-20 DIAGNOSIS — D839 Common variable immunodeficiency, unspecified: Secondary | ICD-10-CM | POA: Diagnosis not present

## 2018-01-21 NOTE — Telephone Encounter (Signed)
Courtesy refill  

## 2018-01-27 DIAGNOSIS — Z6831 Body mass index (BMI) 31.0-31.9, adult: Secondary | ICD-10-CM | POA: Diagnosis not present

## 2018-01-27 DIAGNOSIS — Z0001 Encounter for general adult medical examination with abnormal findings: Secondary | ICD-10-CM | POA: Diagnosis not present

## 2018-01-27 DIAGNOSIS — J208 Acute bronchitis due to other specified organisms: Secondary | ICD-10-CM | POA: Diagnosis not present

## 2018-01-27 DIAGNOSIS — B353 Tinea pedis: Secondary | ICD-10-CM | POA: Diagnosis not present

## 2018-01-27 DIAGNOSIS — Z125 Encounter for screening for malignant neoplasm of prostate: Secondary | ICD-10-CM | POA: Diagnosis not present

## 2018-01-28 DIAGNOSIS — G8929 Other chronic pain: Secondary | ICD-10-CM | POA: Diagnosis not present

## 2018-01-28 DIAGNOSIS — M5440 Lumbago with sciatica, unspecified side: Secondary | ICD-10-CM | POA: Diagnosis not present

## 2018-01-28 DIAGNOSIS — M150 Primary generalized (osteo)arthritis: Secondary | ICD-10-CM | POA: Diagnosis not present

## 2018-01-28 DIAGNOSIS — M4316 Spondylolisthesis, lumbar region: Secondary | ICD-10-CM | POA: Diagnosis not present

## 2018-01-28 DIAGNOSIS — M546 Pain in thoracic spine: Secondary | ICD-10-CM | POA: Diagnosis not present

## 2018-02-01 DIAGNOSIS — M150 Primary generalized (osteo)arthritis: Secondary | ICD-10-CM | POA: Diagnosis not present

## 2018-02-01 DIAGNOSIS — M1612 Unilateral primary osteoarthritis, left hip: Secondary | ICD-10-CM | POA: Diagnosis not present

## 2018-02-02 ENCOUNTER — Other Ambulatory Visit: Payer: Self-pay | Admitting: Allergy and Immunology

## 2018-02-02 DIAGNOSIS — M9903 Segmental and somatic dysfunction of lumbar region: Secondary | ICD-10-CM | POA: Diagnosis not present

## 2018-02-02 DIAGNOSIS — M545 Low back pain: Secondary | ICD-10-CM | POA: Diagnosis not present

## 2018-02-02 DIAGNOSIS — M9902 Segmental and somatic dysfunction of thoracic region: Secondary | ICD-10-CM | POA: Diagnosis not present

## 2018-02-02 DIAGNOSIS — M546 Pain in thoracic spine: Secondary | ICD-10-CM | POA: Diagnosis not present

## 2018-02-02 DIAGNOSIS — G44209 Tension-type headache, unspecified, not intractable: Secondary | ICD-10-CM | POA: Diagnosis not present

## 2018-02-10 DIAGNOSIS — J00 Acute nasopharyngitis [common cold]: Secondary | ICD-10-CM | POA: Diagnosis not present

## 2018-02-10 DIAGNOSIS — J208 Acute bronchitis due to other specified organisms: Secondary | ICD-10-CM | POA: Diagnosis not present

## 2018-02-16 ENCOUNTER — Telehealth: Payer: Self-pay | Admitting: *Deleted

## 2018-02-16 NOTE — Telephone Encounter (Signed)
Patient called and advised his IVIG infusions have become to costly.  He states that [facility] bills him $800 a month for his infusions and can no longer afford same.  He is due for his next infusion tomorrow and wants to know what you think. I did talk to [place] ant that is correct. They advised they do have a charity program and I called patient and advised that he can contact them regarding assistance.  He will talk to them tomorrow but we may have to look into alternatives although I dont know what that may be due to his insurance [employer]

## 2018-02-16 NOTE — Telephone Encounter (Signed)
Will need to have him try a different form of SQ IG other than Hizentra as he had much problem with Hizentra.

## 2018-02-17 DIAGNOSIS — D839 Common variable immunodeficiency, unspecified: Secondary | ICD-10-CM | POA: Diagnosis not present

## 2018-02-18 DIAGNOSIS — J449 Chronic obstructive pulmonary disease, unspecified: Secondary | ICD-10-CM | POA: Diagnosis not present

## 2018-02-19 NOTE — Telephone Encounter (Signed)
Called patient and to inquire regarding assistance from [place] and he advised they signed him up for assistance through them but will only be around $200 off his $800 bill.  I advised him I will try using a specialty pharmacy that has assistance program to see what if any the difference out of pocket to patient and be in touch with him

## 2018-02-24 ENCOUNTER — Other Ambulatory Visit: Payer: Self-pay | Admitting: Allergy and Immunology

## 2018-02-25 ENCOUNTER — Other Ambulatory Visit: Payer: Self-pay | Admitting: *Deleted

## 2018-02-25 DIAGNOSIS — E034 Atrophy of thyroid (acquired): Secondary | ICD-10-CM | POA: Diagnosis not present

## 2018-02-25 DIAGNOSIS — E6609 Other obesity due to excess calories: Secondary | ICD-10-CM | POA: Diagnosis not present

## 2018-02-25 DIAGNOSIS — E782 Mixed hyperlipidemia: Secondary | ICD-10-CM | POA: Diagnosis not present

## 2018-02-25 DIAGNOSIS — D508 Other iron deficiency anemias: Secondary | ICD-10-CM | POA: Diagnosis not present

## 2018-02-25 DIAGNOSIS — D838 Other common variable immunodeficiencies: Secondary | ICD-10-CM | POA: Diagnosis not present

## 2018-02-25 DIAGNOSIS — F3131 Bipolar disorder, current episode depressed, mild: Secondary | ICD-10-CM | POA: Diagnosis not present

## 2018-02-25 DIAGNOSIS — R7301 Impaired fasting glucose: Secondary | ICD-10-CM | POA: Diagnosis not present

## 2018-02-25 DIAGNOSIS — Z6832 Body mass index (BMI) 32.0-32.9, adult: Secondary | ICD-10-CM | POA: Diagnosis not present

## 2018-02-25 MED ORDER — OMEPRAZOLE 40 MG PO CPDR: DELAYED_RELEASE_CAPSULE | ORAL | 0 refills | Status: DC

## 2018-03-03 ENCOUNTER — Encounter: Payer: Self-pay | Admitting: Allergy and Immunology

## 2018-03-03 ENCOUNTER — Ambulatory Visit: Payer: Medicare HMO | Admitting: Allergy and Immunology

## 2018-03-03 VITALS — BP 94/62 | HR 68 | Resp 18 | Ht 64.5 in | Wt 199.0 lb

## 2018-03-03 DIAGNOSIS — G4733 Obstructive sleep apnea (adult) (pediatric): Secondary | ICD-10-CM | POA: Diagnosis not present

## 2018-03-03 DIAGNOSIS — D839 Common variable immunodeficiency, unspecified: Secondary | ICD-10-CM | POA: Diagnosis not present

## 2018-03-03 DIAGNOSIS — J479 Bronchiectasis, uncomplicated: Secondary | ICD-10-CM | POA: Diagnosis not present

## 2018-03-03 DIAGNOSIS — G4734 Idiopathic sleep related nonobstructive alveolar hypoventilation: Secondary | ICD-10-CM | POA: Diagnosis not present

## 2018-03-03 DIAGNOSIS — J449 Chronic obstructive pulmonary disease, unspecified: Secondary | ICD-10-CM | POA: Diagnosis not present

## 2018-03-03 DIAGNOSIS — K219 Gastro-esophageal reflux disease without esophagitis: Secondary | ICD-10-CM

## 2018-03-03 MED ORDER — BECLOMETHASONE DIPROP HFA 80 MCG/ACT IN AERB: INHALATION_SPRAY | RESPIRATORY_TRACT | 5 refills | Status: DC

## 2018-03-03 NOTE — Progress Notes (Signed)
Follow-up Note  Referring Provider: Rochel Brome, MD Primary Provider: Rochel Brome, MD Date of Office Visit: 03/03/2018  Subjective:   Isaac Walsh (DOB: 30-Jan-1966) is a 52 y.o. male who returns to the [facility] on 03/03/2018 in re-evaluation of the following:  HPI: Isaac Walsh returns to this clinic in reevaluation of his CVI D treated with intravenous immunoglobulin infusion presently Privigen 70 g every 4 weeks, history of chronic Pseudomonas respiratory tract infection, chronic bronchiectasis, obstructive lung disease with component of asthma, autoimmune lymphopenia, reflux, and untreated sleep apnea with nocturnal hypoxemia.  I last saw him in this clinic on 27 May 2017.  He was having a difficult time with respiratory tract infections and his immunoglobulin serum levels were relatively low and we have increased his Privigen to 70 g every 4 weeks at the tail end of last summer and he has done very well on this plan.  He states that he is only received 2 antibiotics for "bronchitis" that were easily handled with this therapy.  He believes that his breathing in general is much better with much less coughing and wheezing and his requirement for short acting bronchodilator has decreased to just a few times a week.  Overall his upper airway is doing well and he can breathe without any abnormality.  His reflux is under good control though he still continues to have this chronic chest pain and back pain that has been evaluated extensively in the past.  He still continues with oxygen at nighttime on most nights.  A issue has arisen with the use of his immunoglobulin infusion.  Apparently his insurance company, [employer], is now charging him 20% of the cost of his immunoglobulin infusion.  Allergies as of 03/03/2018      Reactions   Corticosteroids Hives   Androgel Pump [testosterone]    rash   Avelox [moxifloxacin]    Blisters and "doesn't work for me" per pt   Ceclor [cefaclor]    Celestone [betamethasone]    Cephalosporins Hives   Claritin [loratadine]    Effexor [venlafaxine]    Flumist [flu Virus Vaccine]    Pt reports he can tolerate flu shot but not mist   Ketorolac    Naprosyn [naproxen]    Other    Quinolones Hives   Tizanidine    Toradol [ketorolac Tromethamine]    Rocephin [ceftriaxone] Hives, Rash   Vicodin [hydrocodone-acetaminophen] Nausea Only      Medication List      acyclovir 400 MG tablet Commonly known as:  ZOVIRAX Take 400 mg by mouth 5 (five) times daily. As needed   AMBIEN 10 MG tablet Generic drug:  zolpidem Take 10 mg by mouth at bedtime.   beclomethasone 80 MCG/ACT inhaler Commonly known as:  QVAR REDIHALER Inhale two doses twice daily during flare-up as directed.  Rinse, gargle, and spit after use.   carisoprodol 350 MG tablet Commonly known as:  SOMA Take 350 mg by mouth every 6 (six) hours as needed for muscle spasms.   CRESTOR PO Take by mouth.   fenofibrate micronized 134 MG capsule Commonly known as:  LOFIBRA Take 134 mg by mouth daily.   finasteride 1 MG tablet Commonly known as:  PROPECIA Take 1 mg by mouth daily.   HEMATINIC PLUS COMPLEX 106-1 MG Tabs Take 1 capsule by mouth daily.   Immune Globulin 10% 5 GM/50ML Soln Commonly known as:  PRIVIGEN Inject 50 g into the vein every 30 (thirty) days.  ipratropium-albuterol 0.5-2.5 (3) MG/3ML Soln Commonly known as:  DUONEB Take 3 mLs by nebulization every 6 (six) hours as needed.   levothyroxine 100 MCG tablet Commonly known as:  SYNTHROID, LEVOTHROID Take 100 mcg by mouth daily before breakfast.   montelukast 10 MG tablet Commonly known as:  SINGULAIR Take 10 mg by mouth at bedtime.   nitroGLYCERIN 0.4 MG SL tablet Commonly known as:  NITROSTAT Place 0.4 mg under the tongue as needed.   NORVASC PO Take 1 tablet by mouth daily.   omeprazole 40 MG capsule Commonly known as:  PRILOSEC Take one capsule twice daily     oxyCODONE-acetaminophen 10-325 MG tablet Commonly known as:  PERCOCET Take 1 tablet by mouth 4 (four) times daily.   RANEXA 1000 MG SR tablet Generic drug:  ranolazine Take 1,000 mg by mouth 2 (two) times daily.   sertraline 100 MG tablet Commonly known as:  ZOLOFT Take 100 mg by mouth. Take 1 1/2 tablets by mouth   SYMBICORT 160-4.5 MCG/ACT inhaler Generic drug:  budesonide-formoterol INHALE 2 PUFFS BY MOUTH TWICE DAILY   SYSTANE OP Apply to eye every 4 (four) hours.   traZODone 100 MG tablet Commonly known as:  DESYREL Take 200 mg by mouth at bedtime.   XTAMPZA ER 9 MG C12a Generic drug:  oxyCODONE ER TAKE ONE CAPSULE BY MOUTH EVERY 12 HOURS         Past Medical History:  Diagnosis Date  . Abnormal heart rhythm   . Asthma   . Chronic pain syndrome   . Common variable immunodeficiency ([place])   . Congenital dysgammaglobulinemia ([place])   . COPD (chronic obstructive pulmonary disease) ([place])   . Depression with anxiety   . Fatigue   . GERD (gastroesophageal reflux disease)   . Hypercholesterolemia   . Hypothyroidism   . Lymphoma ([place])    as a child  . Nonalcoholic fatty liver disease   . OSA (obstructive sleep apnea)   . Osgood-Schlatter's disease   . Other nonspecific abnormal finding of lung field   . Plantar fasciitis   . Renal stones   . Sciatica   . SCID (severe combined immunodeficiency disease) ([place])   . Trigger finger   . Unspecified deficiency anemia   . Xerostomia     Past Surgical History:  Procedure Laterality Date  . LIVER SURGERY     10% of liver removed  . LYMPHADENECTOMY    . sinusectomy    . TYMPANOSTOMY TUBE PLACEMENT      Review of systems negative except as noted in HPI / PMHx or noted below:  Review of Systems  Constitutional: Negative.   HENT: Negative.   Eyes: Negative.   Respiratory: Negative.   Cardiovascular: Negative.   Gastrointestinal: Negative.   Genitourinary: Negative.   Musculoskeletal: Negative.   Skin:  Negative.   Neurological: Negative.   Endo/Heme/Allergies: Negative.   Psychiatric/Behavioral: Negative.      Objective:   Vitals:   03/03/18 1345  BP: 94/62  Pulse: 68  Resp: 18   Height: 5' 4.5" (163.8 cm)  Weight: 199 lb (90.3 kg)   Physical Exam  Constitutional: He is well-developed, well-nourished, and in no distress.  HENT:  Head: Normocephalic.  Right Ear: External ear and ear canal normal. Tympanic membrane is scarred.  Left Ear: External ear and ear canal normal. Tympanic membrane is scarred.  Nose: Nose normal. No mucosal edema (Septal perforation) or rhinorrhea.  Mouth/Throat: Uvula is midline, oropharynx is clear and moist and mucous  membranes are normal. No oropharyngeal exudate.  Eyes: Conjunctivae are normal.  Neck: Trachea normal. No tracheal tenderness present. No tracheal deviation present. No thyromegaly present.  Cardiovascular: Normal rate, regular rhythm, S1 normal, S2 normal and normal heart sounds.  No murmur heard. Pulmonary/Chest: Breath sounds normal. No stridor. No respiratory distress. He has no wheezes (No wheezing!). He has no rales.  Musculoskeletal: He exhibits no edema.  Lymphadenopathy:       Head (right side): No tonsillar adenopathy present.       Head (left side): No tonsillar adenopathy present.    He has no cervical adenopathy.  Neurological: He is alert. Gait normal.  Skin: No rash noted. He is not diaphoretic. No erythema. Nails show no clubbing.  Psychiatric: Mood and affect normal.    Diagnostics:    Spirometry was performed and demonstrated an FEV1 of 1.38 at 42 % of predicted.  The patient had an Asthma Control Test with the following results: ACT Total Score: 14.    Assessment and Plan:   1. CVID (common variable immunodeficiency) ([place])   2. Chronic intermittent hypoxia with obstructive sleep apnea   3. Bronchiectasis without complication ([place])   4. COPD with asthma ([place])   5. Gastroesophageal reflux disease,  esophagitis presence not specified     1. Continue Privagen infusions at 70G every 4 weeks.   2. Continue Symbicort 160 two inhalations two times per day.   3. Add Qvar 80 REDIHALER two puffs twice a day to Symbicort during 'flare up'  3. Continue Montelukast 22m one tablet one time per day   4. Continue Omeprazole 40 twice a day  5. Continue oxygen at night time  6. Use bronchodilator (Duoneb) or Proair Respiclick (Coupon) 2 puffs every 4-6 hours if needed.  7. Check IgG prior to next 70G infusion.  8. May need to apply for assistance from somewhere  9. Return in six months or earlier if problem.  CReginais doing much better at this point in time with relatively high dose of immunoglobulin infusion as well as the use of his anti-inflammatory medications for his respiratory tract and therapy directed against reflux.  He will continue on all the therapy noted above including immunoglobulin infusion.  We will see if he qualifies for Medicaid to help pay for his survival requiring immunoglobulin infusions or see if there is another source of income that helps pay for this treatment if he does not qualify for Medicaid. Obviously his risk of death goes up dramatically if he does not use immunoglobulin.   EAllena Katz MD Allergy / Immunology [facility]

## 2018-03-03 NOTE — Patient Instructions (Addendum)
  1. Continue Privagen infusions at 70G every 4 weeks.   2. Continue Symbicort 160 two inhalations two times per day.   3. Add Qvar 80 REDIHALER two puffs twice a day to Symbicort during 'flare up'  3. Continue Montelukast 55m one tablet one time per day   4. Continue Omeprazole 40 twice a day  5. Continue oxygen at night time  6. Use bronchodilator (Duoneb) or Proair Respiclick (Coupon) 2 puffs every 4-6 hours if needed.  7. Check IgG prior to next 70G infusion.  8. May need to apply for assistance from somewhere  9. Return in six months or earlier if problem.

## 2018-03-04 ENCOUNTER — Encounter: Payer: Self-pay | Admitting: Allergy and Immunology

## 2018-03-10 ENCOUNTER — Other Ambulatory Visit: Payer: Self-pay | Admitting: Allergy and Immunology

## 2018-03-15 DIAGNOSIS — G8929 Other chronic pain: Secondary | ICD-10-CM | POA: Diagnosis not present

## 2018-03-15 DIAGNOSIS — G894 Chronic pain syndrome: Secondary | ICD-10-CM | POA: Diagnosis not present

## 2018-03-15 DIAGNOSIS — M546 Pain in thoracic spine: Secondary | ICD-10-CM | POA: Diagnosis not present

## 2018-03-15 DIAGNOSIS — M150 Primary generalized (osteo)arthritis: Secondary | ICD-10-CM | POA: Diagnosis not present

## 2018-03-15 DIAGNOSIS — M4316 Spondylolisthesis, lumbar region: Secondary | ICD-10-CM | POA: Diagnosis not present

## 2018-03-18 DIAGNOSIS — J449 Chronic obstructive pulmonary disease, unspecified: Secondary | ICD-10-CM | POA: Diagnosis not present

## 2018-03-19 DIAGNOSIS — J441 Chronic obstructive pulmonary disease with (acute) exacerbation: Secondary | ICD-10-CM | POA: Diagnosis not present

## 2018-03-21 ENCOUNTER — Other Ambulatory Visit: Payer: Self-pay | Admitting: Allergy and Immunology

## 2018-03-24 DIAGNOSIS — J208 Acute bronchitis due to other specified organisms: Secondary | ICD-10-CM | POA: Diagnosis not present

## 2018-04-15 ENCOUNTER — Telehealth: Payer: Self-pay | Admitting: Allergy and Immunology

## 2018-04-15 ENCOUNTER — Other Ambulatory Visit: Payer: Self-pay | Admitting: *Deleted

## 2018-04-15 MED ORDER — ALBUTEROL SULFATE (2.5 MG/3ML) 0.083% IN NEBU: 2.5000 mg | INHALATION_SOLUTION | RESPIRATORY_TRACT | 1 refills | Status: DC | PRN

## 2018-04-15 MED ORDER — IPRATROPIUM-ALBUTEROL 0.5-2.5 (3) MG/3ML IN SOLN: RESPIRATORY_TRACT | 1 refills | Status: DC

## 2018-04-15 NOTE — Telephone Encounter (Signed)
rx sent

## 2018-04-15 NOTE — Telephone Encounter (Signed)
Isaac Walsh needs DUONEB sent in to [facility].

## 2018-04-15 NOTE — Telephone Encounter (Signed)
Rx sent 

## 2018-04-15 NOTE — Telephone Encounter (Signed)
Isaac Walsh called in stated he needed a refill on his liquid nebulizer solution.

## 2018-04-18 DIAGNOSIS — J449 Chronic obstructive pulmonary disease, unspecified: Secondary | ICD-10-CM | POA: Diagnosis not present

## 2018-04-28 DIAGNOSIS — J441 Chronic obstructive pulmonary disease with (acute) exacerbation: Secondary | ICD-10-CM | POA: Diagnosis not present

## 2018-04-28 DIAGNOSIS — M65322 Trigger finger, left index finger: Secondary | ICD-10-CM | POA: Diagnosis not present

## 2018-04-28 DIAGNOSIS — M65332 Trigger finger, left middle finger: Secondary | ICD-10-CM | POA: Diagnosis not present

## 2018-05-18 DIAGNOSIS — J449 Chronic obstructive pulmonary disease, unspecified: Secondary | ICD-10-CM | POA: Diagnosis not present

## 2018-05-27 DIAGNOSIS — E6609 Other obesity due to excess calories: Secondary | ICD-10-CM | POA: Diagnosis not present

## 2018-05-27 DIAGNOSIS — D838 Other common variable immunodeficiencies: Secondary | ICD-10-CM | POA: Diagnosis not present

## 2018-05-27 DIAGNOSIS — R7301 Impaired fasting glucose: Secondary | ICD-10-CM | POA: Diagnosis not present

## 2018-05-27 DIAGNOSIS — E782 Mixed hyperlipidemia: Secondary | ICD-10-CM | POA: Diagnosis not present

## 2018-05-27 DIAGNOSIS — F3131 Bipolar disorder, current episode depressed, mild: Secondary | ICD-10-CM | POA: Diagnosis not present

## 2018-05-27 DIAGNOSIS — D508 Other iron deficiency anemias: Secondary | ICD-10-CM | POA: Diagnosis not present

## 2018-05-27 DIAGNOSIS — E034 Atrophy of thyroid (acquired): Secondary | ICD-10-CM | POA: Diagnosis not present

## 2018-05-27 DIAGNOSIS — Z6832 Body mass index (BMI) 32.0-32.9, adult: Secondary | ICD-10-CM | POA: Diagnosis not present

## 2018-05-31 DIAGNOSIS — M47812 Spondylosis without myelopathy or radiculopathy, cervical region: Secondary | ICD-10-CM | POA: Diagnosis not present

## 2018-05-31 DIAGNOSIS — M150 Primary generalized (osteo)arthritis: Secondary | ICD-10-CM | POA: Diagnosis not present

## 2018-05-31 DIAGNOSIS — G894 Chronic pain syndrome: Secondary | ICD-10-CM | POA: Diagnosis not present

## 2018-05-31 DIAGNOSIS — M542 Cervicalgia: Secondary | ICD-10-CM | POA: Diagnosis not present

## 2018-05-31 DIAGNOSIS — M546 Pain in thoracic spine: Secondary | ICD-10-CM | POA: Diagnosis not present

## 2018-05-31 DIAGNOSIS — G8929 Other chronic pain: Secondary | ICD-10-CM | POA: Diagnosis not present

## 2018-05-31 DIAGNOSIS — Z79891 Long term (current) use of opiate analgesic: Secondary | ICD-10-CM | POA: Diagnosis not present

## 2018-06-01 DIAGNOSIS — H2512 Age-related nuclear cataract, left eye: Secondary | ICD-10-CM | POA: Diagnosis not present

## 2018-06-01 DIAGNOSIS — H547 Unspecified visual loss: Secondary | ICD-10-CM | POA: Diagnosis not present

## 2018-06-01 DIAGNOSIS — H25011 Cortical age-related cataract, right eye: Secondary | ICD-10-CM | POA: Diagnosis not present

## 2018-06-01 DIAGNOSIS — J984 Other disorders of lung: Secondary | ICD-10-CM | POA: Diagnosis not present

## 2018-06-01 DIAGNOSIS — H524 Presbyopia: Secondary | ICD-10-CM | POA: Diagnosis not present

## 2018-06-02 DIAGNOSIS — J181 Lobar pneumonia, unspecified organism: Secondary | ICD-10-CM | POA: Diagnosis not present

## 2018-06-02 DIAGNOSIS — R1012 Left upper quadrant pain: Secondary | ICD-10-CM | POA: Diagnosis not present

## 2018-06-03 DIAGNOSIS — R072 Precordial pain: Secondary | ICD-10-CM | POA: Diagnosis not present

## 2018-06-07 ENCOUNTER — Telehealth: Payer: Self-pay | Admitting: *Deleted

## 2018-06-07 NOTE — Telephone Encounter (Signed)
Received call from nursing that provides Privigen infusion for patient and she advised that there is now shortage of drug and only has 50 grams right now for this month and manufacturer cannot advised when the 20 grams needs for his dose will be available.  She wanted to know if she should go ahead with 50 gram dose for this month and I asked Dr Neldon Mc and he advised to go forward with this infusion and when 20 grams becomes available to infuse. She advised she would go ahead and if there is issue with next month will let us know.

## 2018-06-10 DIAGNOSIS — B370 Candidal stomatitis: Secondary | ICD-10-CM | POA: Diagnosis not present

## 2018-06-18 DIAGNOSIS — J449 Chronic obstructive pulmonary disease, unspecified: Secondary | ICD-10-CM | POA: Diagnosis not present

## 2018-06-24 DIAGNOSIS — R0789 Other chest pain: Secondary | ICD-10-CM | POA: Diagnosis not present

## 2018-06-24 DIAGNOSIS — M546 Pain in thoracic spine: Secondary | ICD-10-CM | POA: Diagnosis not present

## 2018-06-24 DIAGNOSIS — J441 Chronic obstructive pulmonary disease with (acute) exacerbation: Secondary | ICD-10-CM | POA: Diagnosis not present

## 2018-06-30 DIAGNOSIS — R0602 Shortness of breath: Secondary | ICD-10-CM | POA: Diagnosis not present

## 2018-06-30 DIAGNOSIS — J181 Lobar pneumonia, unspecified organism: Secondary | ICD-10-CM | POA: Diagnosis not present

## 2018-06-30 DIAGNOSIS — R05 Cough: Secondary | ICD-10-CM | POA: Diagnosis not present

## 2018-07-18 DIAGNOSIS — J449 Chronic obstructive pulmonary disease, unspecified: Secondary | ICD-10-CM | POA: Diagnosis not present

## 2018-07-28 DIAGNOSIS — M546 Pain in thoracic spine: Secondary | ICD-10-CM | POA: Diagnosis not present

## 2018-07-28 DIAGNOSIS — G894 Chronic pain syndrome: Secondary | ICD-10-CM | POA: Diagnosis not present

## 2018-07-28 DIAGNOSIS — G8929 Other chronic pain: Secondary | ICD-10-CM | POA: Diagnosis not present

## 2018-07-28 DIAGNOSIS — M150 Primary generalized (osteo)arthritis: Secondary | ICD-10-CM | POA: Diagnosis not present

## 2018-08-02 DIAGNOSIS — J441 Chronic obstructive pulmonary disease with (acute) exacerbation: Secondary | ICD-10-CM | POA: Diagnosis not present

## 2018-08-02 DIAGNOSIS — M65321 Trigger finger, right index finger: Secondary | ICD-10-CM | POA: Diagnosis not present

## 2018-08-18 DIAGNOSIS — J449 Chronic obstructive pulmonary disease, unspecified: Secondary | ICD-10-CM | POA: Diagnosis not present

## 2018-09-03 DIAGNOSIS — R7301 Impaired fasting glucose: Secondary | ICD-10-CM | POA: Diagnosis not present

## 2018-09-03 DIAGNOSIS — Z6829 Body mass index (BMI) 29.0-29.9, adult: Secondary | ICD-10-CM | POA: Diagnosis not present

## 2018-09-03 DIAGNOSIS — D508 Other iron deficiency anemias: Secondary | ICD-10-CM | POA: Diagnosis not present

## 2018-09-03 DIAGNOSIS — E6609 Other obesity due to excess calories: Secondary | ICD-10-CM | POA: Diagnosis not present

## 2018-09-03 DIAGNOSIS — E034 Atrophy of thyroid (acquired): Secondary | ICD-10-CM | POA: Diagnosis not present

## 2018-09-03 DIAGNOSIS — J441 Chronic obstructive pulmonary disease with (acute) exacerbation: Secondary | ICD-10-CM | POA: Diagnosis not present

## 2018-09-03 DIAGNOSIS — D838 Other common variable immunodeficiencies: Secondary | ICD-10-CM | POA: Diagnosis not present

## 2018-09-03 DIAGNOSIS — F3131 Bipolar disorder, current episode depressed, mild: Secondary | ICD-10-CM | POA: Diagnosis not present

## 2018-09-03 DIAGNOSIS — E782 Mixed hyperlipidemia: Secondary | ICD-10-CM | POA: Diagnosis not present

## 2018-09-18 DIAGNOSIS — J449 Chronic obstructive pulmonary disease, unspecified: Secondary | ICD-10-CM | POA: Diagnosis not present

## 2018-09-20 ENCOUNTER — Ambulatory Visit (INDEPENDENT_AMBULATORY_CARE_PROVIDER_SITE_OTHER): Payer: Medicare HMO | Admitting: Allergy and Immunology

## 2018-09-20 VITALS — BP 98/52 | HR 88 | Resp 20

## 2018-09-20 DIAGNOSIS — K219 Gastro-esophageal reflux disease without esophagitis: Secondary | ICD-10-CM | POA: Diagnosis not present

## 2018-09-20 DIAGNOSIS — J449 Chronic obstructive pulmonary disease, unspecified: Secondary | ICD-10-CM

## 2018-09-20 DIAGNOSIS — G4734 Idiopathic sleep related nonobstructive alveolar hypoventilation: Secondary | ICD-10-CM | POA: Diagnosis not present

## 2018-09-20 DIAGNOSIS — J479 Bronchiectasis, uncomplicated: Secondary | ICD-10-CM | POA: Diagnosis not present

## 2018-09-20 DIAGNOSIS — D72810 Lymphocytopenia: Secondary | ICD-10-CM

## 2018-09-20 DIAGNOSIS — D839 Common variable immunodeficiency, unspecified: Secondary | ICD-10-CM | POA: Diagnosis not present

## 2018-09-20 MED ORDER — BUDESONIDE 0.5 MG/2ML IN SUSP: RESPIRATORY_TRACT | 5 refills | Status: DC

## 2018-09-20 MED ORDER — EPINEPHRINE 0.3 MG/0.3ML IJ SOAJ: INTRAMUSCULAR | 3 refills | Status: DC

## 2018-09-20 MED ORDER — ALBUTEROL SULFATE HFA 108 (90 BASE) MCG/ACT IN AERS: INHALATION_SPRAY | RESPIRATORY_TRACT | 1 refills | Status: DC

## 2018-09-20 MED ORDER — FORMOTEROL FUMARATE 20 MCG/2ML IN NEBU: INHALATION_SOLUTION | RESPIRATORY_TRACT | 5 refills | Status: DC

## 2018-09-20 MED ORDER — OMEPRAZOLE 40 MG PO CPDR: DELAYED_RELEASE_CAPSULE | ORAL | 5 refills | Status: DC

## 2018-09-20 MED ORDER — REVEFENACIN 175 MCG/3ML IN SOLN: 1.0000 | Freq: Every day | RESPIRATORY_TRACT | 5 refills | Status: DC

## 2018-09-20 NOTE — Progress Notes (Signed)
Follow-up Note  Referring Provider: Rochel Brome, MD Primary Provider: Rochel Brome, MD Date of Office Visit: 09/20/2018  Subjective:   Isaac Walsh (DOB: May 31, 1966) is a 52 y.o. male who returns to the [facility] [place] on 09/20/2018 in re-evaluation of the following:  HPI: Ura returns to this clinic in evaluation of his hypogammaglobulinemia treated with IVIG and inflammatory lung disease and reflux and a history of chronic Pseudomonas respiratory infection with chronic bronchiectasis and autoimmune lymphopenia and untreated sleep apnea with nocturnal hypoxemia.  I have not seen him in this clinic since 03 March 2018.  He continues on 70 g Privigen IVIG infusions without incident.  According to [facility] he has received at least 3 antibiotics since I seen him in this clinic for issues with coughing and wheezing.  Sometimes the material that he coughs up can be yellow to green.  This is a chronic issue.  He may have received one systemic steroid as well to treat this condition.  He has had manipulation of his anti-inflammatory medications for his respiratory track and he is no longer using Symbicort but has recently been put on a triple medication inhaler for the past 10 days and he cannot really tell any difference.  He has not been having any other infections.  When he is feeling good he does not use any short acting bronchodilator but when he is having problems with breathing he uses a bronchodilator 4 times a day.  His reflux is under okay control on his current dosage of proton pump inhibitor which currently is omeprazole 40 mg twice a day.  He does not use oxygen at this point in time at nighttime for his untreated sleep apnea associated with nocturnal hypoxemia.  Allergies as of 09/20/2018      Reactions   Cephalosporins Hives   Other reaction(s): Hives   Corticosteroids Hives   Quinolones Hives   Other reaction(s): Hives Other reaction(s): Hives     Betamethasone Rash, Hives   Other reaction(s): Hives, Rash   Cefaclor Dermatitis, Rash, Hives   Other reaction(s): Hives, Rash   Ceftriaxone Hives, Rash, Dermatitis   Other reaction(s): Hives, Rash   Ketorolac Other (See Comments), Rash, Hives   Other reaction(s): Hives, Rash Manic symptoms   Loratadine Dermatitis, Rash, Hives   Other reaction(s): Hives, Rash   Naproxen Rash, Hives   Other reaction(s): Hives, Rash Looks like shingles   Androgel Pump [testosterone]    rash   Avelox [moxifloxacin]    Blisters and "doesn't work for me" per pt   Effexor [venlafaxine]    Flumist [flu Virus Vaccine]    Pt reports he can tolerate flu shot but not mist   Other    Tizanidine    Toradol [ketorolac Tromethamine]    Vicodin [hydrocodone-acetaminophen] Nausea Only      Medication List      acyclovir 400 MG tablet Commonly known as:  ZOVIRAX Take 400 mg by mouth 5 (five) times daily. As needed   amLODipine 2.5 MG tablet Commonly known as:  NORVASC Take 5 mg by mouth daily.   ARIPiprazole 15 MG tablet Commonly known as:  ABILIFY Take 15 mg by mouth daily.   beclomethasone 80 MCG/ACT inhaler Commonly known as:  QVAR Inhale two doses twice daily during flare-up as directed.  Rinse, gargle, and spit after use.   BELSOMRA 15 MG Tabs Generic drug:  Suvorexant TAKE 1 TABLET BY MOUTH NIGHTLY WITHIN 30 MINS OF BEDTIME. (ONLY IF 7HRS  REMAIN BEFORE TIME OF WAKING   carisoprodol 350 MG tablet Commonly known as:  SOMA Take 350 mg by mouth every 6 (six) hours as needed for muscle spasms.   clotrimazole-betamethasone cream Commonly known as:  LOTRISONE APPLY A SMALL AMOUNT OF CREAM TO AFFECTED AREA(S) TWICE DAILY   CRESTOR PO Take by mouth.   fenofibrate micronized 134 MG capsule Commonly known as:  LOFIBRA Take 134 mg by mouth daily.   finasteride 1 MG tablet Commonly known as:  PROPECIA Take 1 mg by mouth daily.   gabapentin 300 MG capsule Commonly known as:   NEURONTIN TAKE 1 CAPSULE (300 MG TOTAL) BY MOUTH 3 TIMES DAILY.   HEMATINIC PLUS COMPLEX 106-1 MG Tabs Take 1 capsule by mouth 2 (two) times daily.   Immune Globulin 10% 10 GM/100ML Soln Generic drug:  Immune Globulin 10%   ipratropium-albuterol 0.5-2.5 (3) MG/3ML Soln Commonly known as:  DUONEB Use one vial in the nebulizer every 4-6 hours if needed for cough or wheeze   levothyroxine 100 MCG tablet Commonly known as:  SYNTHROID, LEVOTHROID Take 100 mcg by mouth daily before breakfast.   meloxicam 7.5 MG tablet Commonly known as:  MOBIC TAKE 1 TABLET BY MOUTH ONCE TO TWICE DAILY AS NEEDED FOR PAIN TENDONITIS   montelukast 10 MG tablet Commonly known as:  SINGULAIR Take 10 mg by mouth at bedtime.   nitroGLYCERIN 0.4 MG SL tablet Commonly known as:  NITROSTAT Place 0.4 mg under the tongue as needed.   omeprazole 40 MG capsule Commonly known as:  PRILOSEC TAKE 1 CAPSULE BY MOUTH TWICE DAILY. PATIENT MUST HAVE OFFICE VISIT FOR FURTHER REFILLS   oxyCODONE-acetaminophen 10-325 MG tablet Commonly known as:  PERCOCET Take 1 tablet by mouth 4 (four) times daily.   RANEXA 1000 MG SR tablet Generic drug:  ranolazine Take 1,000 mg by mouth 2 (two) times daily.   sertraline 100 MG tablet Commonly known as:  ZOLOFT Take 100 mg by mouth. Take 1 1/2 tablets by mouth   SYSTANE OP Apply to eye every 4 (four) hours.   traZODone 100 MG tablet Commonly known as:  DESYREL Take 200 mg by mouth at bedtime.   TRELEGY ELLIPTA 100-62.5-25 MCG/INH Aepb Generic drug:  Fluticasone-Umeclidin-Vilant Inhale into the lungs.   VASCEPA 1 g Caps Generic drug:  Icosapent Ethyl TAKE 2 CAPSULES BY MOUTH TWICE DAILY WITH FOOD SWALLOW WHOLE. DO NOT CHEW OPEN DISSOLVE AND OR CRUSH   XTAMPZA ER 9 MG C12a Generic drug:  oxyCODONE ER TAKE ONE CAPSULE BY MOUTH EVERY 12 HOURS       Past Medical History:  Diagnosis Date  . Abnormal heart rhythm   . Asthma   . Chronic pain syndrome   . Common  variable immunodeficiency ([place])   . Congenital dysgammaglobulinemia ([place])   . COPD (chronic obstructive pulmonary disease) ([place])   . Depression with anxiety   . Fatigue   . GERD (gastroesophageal reflux disease)   . Hypercholesterolemia   . Hypothyroidism   . Lymphoma ([place])    as a child  . Nonalcoholic fatty liver disease   . OSA (obstructive sleep apnea)   . Osgood-Schlatter's disease   . Other nonspecific abnormal finding of lung field   . Plantar fasciitis   . Renal stones   . Sciatica   . SCID (severe combined immunodeficiency disease) ([place])   . Trigger finger   . Unspecified deficiency anemia   . Xerostomia     Past Surgical History:  Procedure Laterality  Date  . LIVER SURGERY     10% of liver removed  . LYMPHADENECTOMY    . sinusectomy    . TYMPANOSTOMY TUBE PLACEMENT      Review of systems negative except as noted in HPI / PMHx or noted below:  Review of Systems  Constitutional: Negative.   HENT: Negative.   Eyes: Negative.   Respiratory: Negative.   Cardiovascular: Negative.   Gastrointestinal: Negative.   Genitourinary: Negative.   Musculoskeletal: Negative.   Skin: Negative.   Neurological: Negative.   Endo/Heme/Allergies: Negative.   Psychiatric/Behavioral: Negative.      Objective:   Vitals:   09/20/18 1553 09/20/18 1606  BP: (!) 78/52 (!) 98/52  Pulse: 88   Resp: 20   SpO2: 91%           Physical Exam  HENT:  Head: Normocephalic.  Right Ear: External ear and ear canal normal. Tympanic membrane is scarred.  Left Ear: External ear and ear canal normal. Tympanic membrane is scarred.  Nose: No mucosal edema, rhinorrhea or septal deviation (Septal perforation).  Mouth/Throat: Uvula is midline, oropharynx is clear and moist and mucous membranes are normal. No oropharyngeal exudate.  Eyes: Conjunctivae are normal.  Neck: Trachea normal. No tracheal tenderness present. No tracheal deviation present. No thyromegaly present.  Cardiovascular:  Normal rate, regular rhythm, S1 normal, S2 normal and normal heart sounds.  No murmur heard. Pulmonary/Chest: No stridor. No respiratory distress. He has wheezes (Bilateral inspiratory and expiratory wheezes all lung fields). He has no rales.  Musculoskeletal: He exhibits no edema.  Lymphadenopathy:       Head (right side): No tonsillar adenopathy present.       Head (left side): No tonsillar adenopathy present.    He has no cervical adenopathy.  Neurological: He is alert.  Skin: No rash noted. He is not diaphoretic. No erythema. Nails show no clubbing.    Diagnostics:   Oxygen saturation was 91% on room air at rest  Spirometry was not performed as patient wishes.  The patient had an Asthma Control Test with the following results: ACT Total Score: 11.    Assessment and Plan:   1. CVID (common variable immunodeficiency) ([place])   2. Bronchiectasis without complication ([place])   3. COPD with asthma ([place])   4. Gastroesophageal reflux disease, esophagitis presence not specified   5. Nocturnal hypoxemia   6. Lymphopenia     1. Continue Privagen infusions at 70G every 4 weeks.   2.  Nebulized the following medications every day:   A.  Budesonide 0.5 mg nebulized twice a day  B.  Perforomist nebulized twice a day  C.  Yupelri nebulized once a day  3. Continue Montelukast 58m one tablet one time per day   4. Continue Omeprazole 40 twice a day  5. Use bronchodilator (Duoneb) or Proair HFA 2 puffs every 4-6 hours if needed.  6.  Obtain nocturnal oximetry study on room air  7.  Obtain blood - IgA/G/M, CBC with differential  8.  Obtain high-resolution chest CT scan  9. Obtain fall flu vaccine  10. Return in six months or earlier if problem.  CJasehas very significant problems with his respiratory tract which has always been a major issue for him symptomatically.  He has a collection of various issues including CVID associated inflammatory lung disease and bronchiectasis  and chronic Pseudomonas respiratory tract colonization.  I will obtain a high-resolution CT scan to see if we can discern which of these issues  is playing a greater role regarding his symptoms.  I have changed around his anti-inflammatory medications having him use nebulized formulation with the hope of delivering more medication to more areas within his lung.  It would be best for him not to receive recurrent antibiotics or systemic steroids given his history of chronic Pseudomonas respiratory tract colonization as non-antipseudomonal antibiotics and systemic steroids will exacerbate growth of Pseudomonas..  As well, he will continue on therapy for reflux as noted above and we need to determine if he has nocturnal hypoxemia and whether or not he should use his oxygen at nighttime.  I will check his immunoglobulin levels to make sure that we are adequately treating his immunoglobulin deficit and to further evaluate for his autoimmune lymphopenia.  Allena Katz, MD Allergy / Immunology [place]

## 2018-09-20 NOTE — Patient Instructions (Addendum)
  1. Continue Privagen infusions at 70G every 4 weeks.   2.  Nebulized the following medications every day:   A.  Budesonide 0.5 mg nebulized twice a day  B.  Perforomist nebulized twice a day  C.  Yupelri nebulized once a day  3. Continue Montelukast 26m one tablet one time per day   4. Continue Omeprazole 40 twice a day  5. Use bronchodilator (Duoneb) or Proair HFA 2 puffs every 4-6 hours if needed.  6.  Obtain nocturnal oximetry study on room air  7.  Obtain blood - IgA/G/M, CBC with differential  8.  Obtain high-resolution chest CT scan  9. Obtain fall flu vaccine  10. Return in 8 weeks or earlier if problem.

## 2018-09-21 ENCOUNTER — Encounter: Payer: Self-pay | Admitting: Allergy and Immunology

## 2018-09-21 ENCOUNTER — Telehealth: Payer: Self-pay | Admitting: Allergy and Immunology

## 2018-09-21 DIAGNOSIS — D839 Common variable immunodeficiency, unspecified: Secondary | ICD-10-CM | POA: Diagnosis not present

## 2018-09-21 DIAGNOSIS — D72810 Lymphocytopenia: Secondary | ICD-10-CM | POA: Diagnosis not present

## 2018-09-21 NOTE — Telephone Encounter (Signed)
Filed MCD for March 2019 visit - kt

## 2018-09-21 NOTE — Telephone Encounter (Signed)
Isaac Walsh came in the office and stated he had medicaid.  He states he had medicaid since last year and he had no idea.  He would like for [place] to refile everything with medicaid included as far back as we can.

## 2018-09-22 ENCOUNTER — Telehealth: Payer: Self-pay

## 2018-09-22 ENCOUNTER — Other Ambulatory Visit: Payer: Self-pay | Admitting: *Deleted

## 2018-09-22 MED ORDER — FORMOTEROL FUMARATE 20 MCG/2ML IN NEBU: INHALATION_SOLUTION | RESPIRATORY_TRACT | 5 refills | Status: DC

## 2018-09-22 NOTE — Telephone Encounter (Signed)
Informed patient that he is scheduled for Chest Ct Scan at [facility] on Friday, September 27th, needing to arrive at 10:30 am.  Also asked him not to eat any solid food starting 2 hours prior to test; liquid is ok to consume. Faxed order form to [facility].  NOTE: PA # [id number], valid 09/22/18 to 10/22/18

## 2018-09-24 DIAGNOSIS — J479 Bronchiectasis, uncomplicated: Secondary | ICD-10-CM | POA: Diagnosis not present

## 2018-09-24 DIAGNOSIS — R918 Other nonspecific abnormal finding of lung field: Secondary | ICD-10-CM | POA: Diagnosis not present

## 2018-09-24 DIAGNOSIS — B965 Pseudomonas (aeruginosa) (mallei) (pseudomallei) as the cause of diseases classified elsewhere: Secondary | ICD-10-CM | POA: Diagnosis not present

## 2018-09-24 DIAGNOSIS — D839 Common variable immunodeficiency, unspecified: Secondary | ICD-10-CM | POA: Diagnosis not present

## 2018-09-27 ENCOUNTER — Telehealth: Payer: Self-pay | Admitting: *Deleted

## 2018-09-27 NOTE — Telephone Encounter (Signed)
Called patient to advise that Dr Neldon Mc had received his lab test from [facility] and his IgG was 357 which is low.  Advised him of increase of Privigen to 100 grams every 3 weeks and will send order to [facility].  Patient also advised he had to stop the nebulized medications that Dr Mylo Red had prescribed for him do to side effects such as bad stomach cramps, halo vision and cough.  He stated he went back on his previous medications.FYI

## 2018-09-28 ENCOUNTER — Telehealth: Payer: Self-pay

## 2018-09-28 DIAGNOSIS — M546 Pain in thoracic spine: Secondary | ICD-10-CM | POA: Diagnosis not present

## 2018-09-28 DIAGNOSIS — M5440 Lumbago with sciatica, unspecified side: Secondary | ICD-10-CM | POA: Diagnosis not present

## 2018-09-28 DIAGNOSIS — G8929 Other chronic pain: Secondary | ICD-10-CM | POA: Diagnosis not present

## 2018-09-28 DIAGNOSIS — M150 Primary generalized (osteo)arthritis: Secondary | ICD-10-CM | POA: Diagnosis not present

## 2018-09-28 NOTE — Telephone Encounter (Signed)
Patient advised to come by office for order to [facility] to get sputum tests

## 2018-09-28 NOTE — Telephone Encounter (Signed)
Left message for patient to call the office.  Please inform patient that Dr.Kozlow did review the chest CT scan and would like to check for mycobacterium infection.  We need patient to come by office and pick up order form for [facility] to get a sputum smear and culture completed.  Order form placed at our front desk for patient to pick up. NOTE: See scanned results in Epic.

## 2018-09-29 ENCOUNTER — Encounter: Payer: Self-pay | Admitting: Allergy and Immunology

## 2018-09-30 ENCOUNTER — Encounter: Payer: Self-pay | Admitting: Allergy and Immunology

## 2018-10-04 DIAGNOSIS — G4734 Idiopathic sleep related nonobstructive alveolar hypoventilation: Secondary | ICD-10-CM | POA: Diagnosis not present

## 2018-10-07 ENCOUNTER — Other Ambulatory Visit: Payer: Self-pay | Admitting: Allergy and Immunology

## 2018-10-07 ENCOUNTER — Telehealth: Payer: Self-pay | Admitting: Allergy and Immunology

## 2018-10-07 NOTE — Telephone Encounter (Signed)
Please inform patient that we try to give patient somewhere between 0.5 g and 1 g for every kilogram of weight.  He weighs about 200 pounds.  If he feels uncomfortable with 100 g infusions then we can try 90 g infusion but it is obvious that at 70 g infusion he is not getting his IgG level  high enough.  If we can get his IgG level high enough he will probably have much less problem with his respiratory tract and may be some of his coughing and other respiratory tract symptoms will resolve.  If his infusion nurse has a concern she needs to relay the concern directly to me and not provide you alarmist information about the therapy we have prescribed.

## 2018-10-07 NOTE — Telephone Encounter (Signed)
I explained the reasoning behind why Dr. Neldon Mc wanted to go to 100 grams and that it was to help with Isaac Walsh' overall health. He has agreed to have the infusions.

## 2018-10-07 NOTE — Telephone Encounter (Signed)
Isaac Walsh called in concerned about increasing Privagen to 179ms.  He states the nurse that gives him his infusions told him that was a "dangerous" amount and "it is hard to decrease dosages once they reach high levels."  CKerrionstates he does not want to be stuck to an IV all the time and wants to express concern about the dosage and would like to speak with Dr. [name]  Please advise.

## 2018-10-08 ENCOUNTER — Encounter: Payer: Self-pay | Admitting: Allergy and Immunology

## 2018-10-08 DIAGNOSIS — A310 Pulmonary mycobacterial infection: Secondary | ICD-10-CM | POA: Diagnosis not present

## 2018-10-11 ENCOUNTER — Telehealth: Payer: Self-pay | Admitting: *Deleted

## 2018-10-11 NOTE — Telephone Encounter (Signed)
Per Dr Neldon Mc called patient and advised no growth with sputum culture

## 2018-10-12 ENCOUNTER — Encounter: Payer: Self-pay | Admitting: Allergy and Immunology

## 2018-10-18 DIAGNOSIS — J449 Chronic obstructive pulmonary disease, unspecified: Secondary | ICD-10-CM | POA: Diagnosis not present

## 2018-10-19 DIAGNOSIS — D508 Other iron deficiency anemias: Secondary | ICD-10-CM | POA: Diagnosis not present

## 2018-10-19 DIAGNOSIS — F5101 Primary insomnia: Secondary | ICD-10-CM | POA: Diagnosis not present

## 2018-10-19 DIAGNOSIS — J441 Chronic obstructive pulmonary disease with (acute) exacerbation: Secondary | ICD-10-CM | POA: Diagnosis not present

## 2018-10-19 DIAGNOSIS — Z23 Encounter for immunization: Secondary | ICD-10-CM | POA: Diagnosis not present

## 2018-11-10 DIAGNOSIS — D802 Selective deficiency of immunoglobulin A [IgA]: Secondary | ICD-10-CM | POA: Diagnosis not present

## 2018-11-10 DIAGNOSIS — R5383 Other fatigue: Secondary | ICD-10-CM | POA: Diagnosis not present

## 2018-11-10 DIAGNOSIS — D509 Iron deficiency anemia, unspecified: Secondary | ICD-10-CM | POA: Diagnosis not present

## 2018-11-10 DIAGNOSIS — D72810 Lymphocytopenia: Secondary | ICD-10-CM | POA: Diagnosis not present

## 2018-11-18 DIAGNOSIS — J449 Chronic obstructive pulmonary disease, unspecified: Secondary | ICD-10-CM | POA: Diagnosis not present

## 2018-11-19 ENCOUNTER — Other Ambulatory Visit: Payer: Self-pay | Admitting: Allergy and Immunology

## 2018-11-23 DIAGNOSIS — M150 Primary generalized (osteo)arthritis: Secondary | ICD-10-CM | POA: Diagnosis not present

## 2018-11-23 DIAGNOSIS — G8929 Other chronic pain: Secondary | ICD-10-CM | POA: Diagnosis not present

## 2018-11-23 DIAGNOSIS — M546 Pain in thoracic spine: Secondary | ICD-10-CM | POA: Diagnosis not present

## 2018-11-23 DIAGNOSIS — G894 Chronic pain syndrome: Secondary | ICD-10-CM | POA: Diagnosis not present

## 2018-11-23 DIAGNOSIS — M4316 Spondylolisthesis, lumbar region: Secondary | ICD-10-CM | POA: Diagnosis not present

## 2018-11-23 DIAGNOSIS — M5416 Radiculopathy, lumbar region: Secondary | ICD-10-CM | POA: Diagnosis not present

## 2018-11-24 DIAGNOSIS — J208 Acute bronchitis due to other specified organisms: Secondary | ICD-10-CM | POA: Diagnosis not present

## 2018-11-24 DIAGNOSIS — M65332 Trigger finger, left middle finger: Secondary | ICD-10-CM | POA: Diagnosis not present

## 2018-12-06 DIAGNOSIS — M5416 Radiculopathy, lumbar region: Secondary | ICD-10-CM | POA: Diagnosis not present

## 2018-12-06 DIAGNOSIS — M545 Low back pain: Secondary | ICD-10-CM | POA: Diagnosis not present

## 2018-12-13 DIAGNOSIS — R0902 Hypoxemia: Secondary | ICD-10-CM | POA: Diagnosis not present

## 2018-12-15 ENCOUNTER — Encounter: Payer: Self-pay | Admitting: *Deleted

## 2018-12-15 ENCOUNTER — Telehealth: Payer: Self-pay | Admitting: *Deleted

## 2018-12-15 NOTE — Telephone Encounter (Signed)
Murlean Iba the results of his overnight pulse oximetry study per Dr. Bruna Potter request. It is advised that Gerald Stabs use 2 liters of O2 at night and that he should consider revisiting his sleep pulmonologist.

## 2018-12-18 DIAGNOSIS — J449 Chronic obstructive pulmonary disease, unspecified: Secondary | ICD-10-CM | POA: Diagnosis not present

## 2018-12-24 DIAGNOSIS — F5101 Primary insomnia: Secondary | ICD-10-CM | POA: Diagnosis not present

## 2018-12-24 DIAGNOSIS — D508 Other iron deficiency anemias: Secondary | ICD-10-CM | POA: Diagnosis not present

## 2018-12-24 DIAGNOSIS — D838 Other common variable immunodeficiencies: Secondary | ICD-10-CM | POA: Diagnosis not present

## 2018-12-24 DIAGNOSIS — E663 Overweight: Secondary | ICD-10-CM | POA: Diagnosis not present

## 2018-12-24 DIAGNOSIS — E034 Atrophy of thyroid (acquired): Secondary | ICD-10-CM | POA: Diagnosis not present

## 2018-12-24 DIAGNOSIS — F3131 Bipolar disorder, current episode depressed, mild: Secondary | ICD-10-CM | POA: Diagnosis not present

## 2018-12-24 DIAGNOSIS — R7301 Impaired fasting glucose: Secondary | ICD-10-CM | POA: Diagnosis not present

## 2018-12-24 DIAGNOSIS — E782 Mixed hyperlipidemia: Secondary | ICD-10-CM | POA: Diagnosis not present

## 2018-12-24 DIAGNOSIS — Z6829 Body mass index (BMI) 29.0-29.9, adult: Secondary | ICD-10-CM | POA: Diagnosis not present

## 2019-01-18 DIAGNOSIS — J449 Chronic obstructive pulmonary disease, unspecified: Secondary | ICD-10-CM | POA: Diagnosis not present

## 2019-01-25 DIAGNOSIS — G8929 Other chronic pain: Secondary | ICD-10-CM | POA: Diagnosis not present

## 2019-01-25 DIAGNOSIS — M47817 Spondylosis without myelopathy or radiculopathy, lumbosacral region: Secondary | ICD-10-CM | POA: Diagnosis not present

## 2019-01-25 DIAGNOSIS — M546 Pain in thoracic spine: Secondary | ICD-10-CM | POA: Diagnosis not present

## 2019-01-25 DIAGNOSIS — M150 Primary generalized (osteo)arthritis: Secondary | ICD-10-CM | POA: Diagnosis not present

## 2019-01-25 DIAGNOSIS — G894 Chronic pain syndrome: Secondary | ICD-10-CM | POA: Diagnosis not present

## 2019-01-26 DIAGNOSIS — R10817 Generalized abdominal tenderness: Secondary | ICD-10-CM | POA: Diagnosis not present

## 2019-01-26 DIAGNOSIS — M65321 Trigger finger, right index finger: Secondary | ICD-10-CM | POA: Diagnosis not present

## 2019-01-26 DIAGNOSIS — J208 Acute bronchitis due to other specified organisms: Secondary | ICD-10-CM | POA: Diagnosis not present

## 2019-02-10 DIAGNOSIS — R072 Precordial pain: Secondary | ICD-10-CM | POA: Diagnosis not present

## 2019-02-10 DIAGNOSIS — R079 Chest pain, unspecified: Secondary | ICD-10-CM | POA: Diagnosis not present

## 2019-02-11 DIAGNOSIS — H00012 Hordeolum externum right lower eyelid: Secondary | ICD-10-CM | POA: Diagnosis not present

## 2019-02-11 DIAGNOSIS — J441 Chronic obstructive pulmonary disease with (acute) exacerbation: Secondary | ICD-10-CM | POA: Diagnosis not present

## 2019-02-16 DIAGNOSIS — R079 Chest pain, unspecified: Secondary | ICD-10-CM | POA: Diagnosis not present

## 2019-02-16 DIAGNOSIS — R072 Precordial pain: Secondary | ICD-10-CM | POA: Diagnosis not present

## 2019-02-18 DIAGNOSIS — I214 Non-ST elevation (NSTEMI) myocardial infarction: Secondary | ICD-10-CM | POA: Diagnosis not present

## 2019-02-18 DIAGNOSIS — D801 Nonfamilial hypogammaglobulinemia: Secondary | ICD-10-CM | POA: Diagnosis not present

## 2019-02-18 DIAGNOSIS — I25110 Atherosclerotic heart disease of native coronary artery with unstable angina pectoris: Secondary | ICD-10-CM | POA: Diagnosis not present

## 2019-02-18 DIAGNOSIS — Z87891 Personal history of nicotine dependence: Secondary | ICD-10-CM | POA: Diagnosis not present

## 2019-02-18 DIAGNOSIS — J90 Pleural effusion, not elsewhere classified: Secondary | ICD-10-CM | POA: Diagnosis not present

## 2019-02-18 DIAGNOSIS — I10 Essential (primary) hypertension: Secondary | ICD-10-CM | POA: Diagnosis not present

## 2019-02-18 DIAGNOSIS — D839 Common variable immunodeficiency, unspecified: Secondary | ICD-10-CM | POA: Diagnosis not present

## 2019-02-18 DIAGNOSIS — D62 Acute posthemorrhagic anemia: Secondary | ICD-10-CM | POA: Diagnosis not present

## 2019-02-18 DIAGNOSIS — R51 Headache: Secondary | ICD-10-CM | POA: Diagnosis not present

## 2019-02-18 DIAGNOSIS — F419 Anxiety disorder, unspecified: Secondary | ICD-10-CM | POA: Diagnosis not present

## 2019-02-18 DIAGNOSIS — J449 Chronic obstructive pulmonary disease, unspecified: Secondary | ICD-10-CM | POA: Diagnosis not present

## 2019-02-18 DIAGNOSIS — I97631 Postprocedural hematoma of a circulatory system organ or structure following cardiac bypass: Secondary | ICD-10-CM | POA: Diagnosis not present

## 2019-02-18 DIAGNOSIS — R079 Chest pain, unspecified: Secondary | ICD-10-CM | POA: Diagnosis not present

## 2019-02-18 DIAGNOSIS — D838 Other common variable immunodeficiencies: Secondary | ICD-10-CM | POA: Diagnosis not present

## 2019-02-18 DIAGNOSIS — J441 Chronic obstructive pulmonary disease with (acute) exacerbation: Secondary | ICD-10-CM | POA: Diagnosis not present

## 2019-02-18 DIAGNOSIS — R0602 Shortness of breath: Secondary | ICD-10-CM | POA: Diagnosis not present

## 2019-02-18 DIAGNOSIS — J9601 Acute respiratory failure with hypoxia: Secondary | ICD-10-CM | POA: Diagnosis not present

## 2019-02-18 DIAGNOSIS — F329 Major depressive disorder, single episode, unspecified: Secondary | ICD-10-CM | POA: Diagnosis not present

## 2019-02-19 DIAGNOSIS — R0602 Shortness of breath: Secondary | ICD-10-CM | POA: Diagnosis not present

## 2019-02-19 DIAGNOSIS — I25119 Atherosclerotic heart disease of native coronary artery with unspecified angina pectoris: Secondary | ICD-10-CM | POA: Diagnosis not present

## 2019-02-19 DIAGNOSIS — J95821 Acute postprocedural respiratory failure: Secondary | ICD-10-CM | POA: Diagnosis not present

## 2019-02-19 DIAGNOSIS — G4700 Insomnia, unspecified: Secondary | ICD-10-CM | POA: Diagnosis not present

## 2019-02-19 DIAGNOSIS — I2510 Atherosclerotic heart disease of native coronary artery without angina pectoris: Secondary | ICD-10-CM | POA: Diagnosis not present

## 2019-02-19 DIAGNOSIS — D62 Acute posthemorrhagic anemia: Secondary | ICD-10-CM | POA: Diagnosis not present

## 2019-02-19 DIAGNOSIS — D839 Common variable immunodeficiency, unspecified: Secondary | ICD-10-CM | POA: Diagnosis not present

## 2019-02-19 DIAGNOSIS — B341 Enterovirus infection, unspecified: Secondary | ICD-10-CM | POA: Diagnosis not present

## 2019-02-19 DIAGNOSIS — R05 Cough: Secondary | ICD-10-CM | POA: Diagnosis not present

## 2019-02-19 DIAGNOSIS — I97631 Postprocedural hematoma of a circulatory system organ or structure following cardiac bypass: Secondary | ICD-10-CM | POA: Diagnosis not present

## 2019-02-19 DIAGNOSIS — B965 Pseudomonas (aeruginosa) (mallei) (pseudomallei) as the cause of diseases classified elsewhere: Secondary | ICD-10-CM | POA: Diagnosis not present

## 2019-02-19 DIAGNOSIS — J984 Other disorders of lung: Secondary | ICD-10-CM | POA: Diagnosis not present

## 2019-02-19 DIAGNOSIS — R079 Chest pain, unspecified: Secondary | ICD-10-CM | POA: Diagnosis not present

## 2019-02-19 DIAGNOSIS — F329 Major depressive disorder, single episode, unspecified: Secondary | ICD-10-CM | POA: Diagnosis not present

## 2019-02-19 DIAGNOSIS — R000 Tachycardia, unspecified: Secondary | ICD-10-CM | POA: Diagnosis not present

## 2019-02-19 DIAGNOSIS — I2723 Pulmonary hypertension due to lung diseases and hypoxia: Secondary | ICD-10-CM | POA: Diagnosis not present

## 2019-02-19 DIAGNOSIS — E039 Hypothyroidism, unspecified: Secondary | ICD-10-CM | POA: Diagnosis not present

## 2019-02-19 DIAGNOSIS — D803 Selective deficiency of immunoglobulin G [IgG] subclasses: Secondary | ICD-10-CM | POA: Diagnosis not present

## 2019-02-19 DIAGNOSIS — G8929 Other chronic pain: Secondary | ICD-10-CM | POA: Diagnosis not present

## 2019-02-19 DIAGNOSIS — G4733 Obstructive sleep apnea (adult) (pediatric): Secondary | ICD-10-CM | POA: Diagnosis not present

## 2019-02-19 DIAGNOSIS — R072 Precordial pain: Secondary | ICD-10-CM | POA: Diagnosis not present

## 2019-02-19 DIAGNOSIS — M549 Dorsalgia, unspecified: Secondary | ICD-10-CM | POA: Diagnosis not present

## 2019-02-19 DIAGNOSIS — R7989 Other specified abnormal findings of blood chemistry: Secondary | ICD-10-CM | POA: Diagnosis not present

## 2019-02-19 DIAGNOSIS — R0600 Dyspnea, unspecified: Secondary | ICD-10-CM | POA: Diagnosis not present

## 2019-02-19 DIAGNOSIS — Z87891 Personal history of nicotine dependence: Secondary | ICD-10-CM | POA: Diagnosis not present

## 2019-02-19 DIAGNOSIS — J90 Pleural effusion, not elsewhere classified: Secondary | ICD-10-CM | POA: Diagnosis not present

## 2019-02-19 DIAGNOSIS — J9811 Atelectasis: Secondary | ICD-10-CM | POA: Diagnosis not present

## 2019-02-19 DIAGNOSIS — J479 Bronchiectasis, uncomplicated: Secondary | ICD-10-CM | POA: Diagnosis not present

## 2019-02-19 DIAGNOSIS — J449 Chronic obstructive pulmonary disease, unspecified: Secondary | ICD-10-CM | POA: Diagnosis not present

## 2019-02-19 DIAGNOSIS — R21 Rash and other nonspecific skin eruption: Secondary | ICD-10-CM | POA: Diagnosis not present

## 2019-02-19 DIAGNOSIS — R0789 Other chest pain: Secondary | ICD-10-CM | POA: Diagnosis not present

## 2019-02-19 DIAGNOSIS — Z951 Presence of aortocoronary bypass graft: Secondary | ICD-10-CM | POA: Diagnosis not present

## 2019-02-19 DIAGNOSIS — Z862 Personal history of diseases of the blood and blood-forming organs and certain disorders involving the immune mechanism: Secondary | ICD-10-CM | POA: Diagnosis not present

## 2019-02-19 DIAGNOSIS — J810 Acute pulmonary edema: Secondary | ICD-10-CM | POA: Diagnosis not present

## 2019-02-19 DIAGNOSIS — I10 Essential (primary) hypertension: Secondary | ICD-10-CM | POA: Diagnosis not present

## 2019-02-19 DIAGNOSIS — B348 Other viral infections of unspecified site: Secondary | ICD-10-CM | POA: Diagnosis not present

## 2019-02-19 DIAGNOSIS — D150 Benign neoplasm of thymus: Secondary | ICD-10-CM | POA: Diagnosis not present

## 2019-02-19 DIAGNOSIS — Z01810 Encounter for preprocedural cardiovascular examination: Secondary | ICD-10-CM | POA: Diagnosis not present

## 2019-02-19 DIAGNOSIS — I214 Non-ST elevation (NSTEMI) myocardial infarction: Secondary | ICD-10-CM | POA: Diagnosis not present

## 2019-02-19 DIAGNOSIS — D801 Nonfamilial hypogammaglobulinemia: Secondary | ICD-10-CM | POA: Diagnosis not present

## 2019-02-19 DIAGNOSIS — E785 Hyperlipidemia, unspecified: Secondary | ICD-10-CM | POA: Diagnosis not present

## 2019-02-19 DIAGNOSIS — J9601 Acute respiratory failure with hypoxia: Secondary | ICD-10-CM | POA: Diagnosis not present

## 2019-02-19 DIAGNOSIS — I340 Nonrheumatic mitral (valve) insufficiency: Secondary | ICD-10-CM | POA: Diagnosis not present

## 2019-02-19 DIAGNOSIS — I25110 Atherosclerotic heart disease of native coronary artery with unstable angina pectoris: Secondary | ICD-10-CM | POA: Diagnosis not present

## 2019-02-19 DIAGNOSIS — R9431 Abnormal electrocardiogram [ECG] [EKG]: Secondary | ICD-10-CM | POA: Diagnosis not present

## 2019-02-19 DIAGNOSIS — F419 Anxiety disorder, unspecified: Secondary | ICD-10-CM | POA: Diagnosis not present

## 2019-02-19 DIAGNOSIS — Z9911 Dependence on respirator [ventilator] status: Secondary | ICD-10-CM | POA: Diagnosis not present

## 2019-02-19 DIAGNOSIS — I959 Hypotension, unspecified: Secondary | ICD-10-CM | POA: Diagnosis not present

## 2019-02-19 DIAGNOSIS — R451 Restlessness and agitation: Secondary | ICD-10-CM | POA: Diagnosis not present

## 2019-02-21 ENCOUNTER — Ambulatory Visit: Payer: Self-pay | Admitting: Allergy and Immunology

## 2019-02-21 DIAGNOSIS — R000 Tachycardia, unspecified: Secondary | ICD-10-CM | POA: Diagnosis not present

## 2019-02-21 DIAGNOSIS — R9431 Abnormal electrocardiogram [ECG] [EKG]: Secondary | ICD-10-CM | POA: Diagnosis not present

## 2019-02-22 DIAGNOSIS — R9431 Abnormal electrocardiogram [ECG] [EKG]: Secondary | ICD-10-CM | POA: Diagnosis not present

## 2019-02-22 DIAGNOSIS — D839 Common variable immunodeficiency, unspecified: Secondary | ICD-10-CM | POA: Diagnosis not present

## 2019-02-22 DIAGNOSIS — Z87891 Personal history of nicotine dependence: Secondary | ICD-10-CM | POA: Diagnosis not present

## 2019-02-22 DIAGNOSIS — R072 Precordial pain: Secondary | ICD-10-CM | POA: Diagnosis not present

## 2019-02-22 DIAGNOSIS — Z01810 Encounter for preprocedural cardiovascular examination: Secondary | ICD-10-CM | POA: Diagnosis not present

## 2019-02-22 DIAGNOSIS — R0602 Shortness of breath: Secondary | ICD-10-CM | POA: Diagnosis not present

## 2019-03-11 DIAGNOSIS — Z87891 Personal history of nicotine dependence: Secondary | ICD-10-CM | POA: Diagnosis not present

## 2019-03-11 DIAGNOSIS — J479 Bronchiectasis, uncomplicated: Secondary | ICD-10-CM | POA: Diagnosis not present

## 2019-03-11 DIAGNOSIS — J449 Chronic obstructive pulmonary disease, unspecified: Secondary | ICD-10-CM | POA: Diagnosis not present

## 2019-03-11 DIAGNOSIS — R911 Solitary pulmonary nodule: Secondary | ICD-10-CM | POA: Diagnosis not present

## 2019-03-11 DIAGNOSIS — G4733 Obstructive sleep apnea (adult) (pediatric): Secondary | ICD-10-CM | POA: Diagnosis not present

## 2019-03-11 DIAGNOSIS — R942 Abnormal results of pulmonary function studies: Secondary | ICD-10-CM | POA: Diagnosis not present

## 2019-03-14 DIAGNOSIS — J9601 Acute respiratory failure with hypoxia: Secondary | ICD-10-CM | POA: Diagnosis not present

## 2019-03-14 DIAGNOSIS — I25708 Atherosclerosis of coronary artery bypass graft(s), unspecified, with other forms of angina pectoris: Secondary | ICD-10-CM | POA: Diagnosis not present

## 2019-03-14 DIAGNOSIS — I214 Non-ST elevation (NSTEMI) myocardial infarction: Secondary | ICD-10-CM | POA: Diagnosis not present

## 2019-03-14 DIAGNOSIS — B3749 Other urogenital candidiasis: Secondary | ICD-10-CM | POA: Diagnosis not present

## 2019-03-14 DIAGNOSIS — B029 Zoster without complications: Secondary | ICD-10-CM | POA: Diagnosis not present

## 2019-03-14 DIAGNOSIS — D500 Iron deficiency anemia secondary to blood loss (chronic): Secondary | ICD-10-CM | POA: Diagnosis not present

## 2019-03-18 DIAGNOSIS — B3749 Other urogenital candidiasis: Secondary | ICD-10-CM | POA: Diagnosis not present

## 2019-03-18 DIAGNOSIS — I214 Non-ST elevation (NSTEMI) myocardial infarction: Secondary | ICD-10-CM | POA: Diagnosis not present

## 2019-03-18 DIAGNOSIS — G4730 Sleep apnea, unspecified: Secondary | ICD-10-CM | POA: Diagnosis not present

## 2019-03-18 DIAGNOSIS — Z48812 Encounter for surgical aftercare following surgery on the circulatory system: Secondary | ICD-10-CM | POA: Diagnosis not present

## 2019-03-18 DIAGNOSIS — G8929 Other chronic pain: Secondary | ICD-10-CM | POA: Diagnosis not present

## 2019-03-18 DIAGNOSIS — J449 Chronic obstructive pulmonary disease, unspecified: Secondary | ICD-10-CM | POA: Diagnosis not present

## 2019-03-18 DIAGNOSIS — B029 Zoster without complications: Secondary | ICD-10-CM | POA: Diagnosis not present

## 2019-03-18 DIAGNOSIS — D62 Acute posthemorrhagic anemia: Secondary | ICD-10-CM | POA: Diagnosis not present

## 2019-03-18 DIAGNOSIS — M5430 Sciatica, unspecified side: Secondary | ICD-10-CM | POA: Diagnosis not present

## 2019-03-19 DIAGNOSIS — B029 Zoster without complications: Secondary | ICD-10-CM | POA: Diagnosis not present

## 2019-03-19 DIAGNOSIS — G8929 Other chronic pain: Secondary | ICD-10-CM | POA: Diagnosis not present

## 2019-03-19 DIAGNOSIS — B3749 Other urogenital candidiasis: Secondary | ICD-10-CM | POA: Diagnosis not present

## 2019-03-19 DIAGNOSIS — D62 Acute posthemorrhagic anemia: Secondary | ICD-10-CM | POA: Diagnosis not present

## 2019-03-19 DIAGNOSIS — Z48812 Encounter for surgical aftercare following surgery on the circulatory system: Secondary | ICD-10-CM | POA: Diagnosis not present

## 2019-03-19 DIAGNOSIS — J449 Chronic obstructive pulmonary disease, unspecified: Secondary | ICD-10-CM | POA: Diagnosis not present

## 2019-03-19 DIAGNOSIS — M5430 Sciatica, unspecified side: Secondary | ICD-10-CM | POA: Diagnosis not present

## 2019-03-19 DIAGNOSIS — I214 Non-ST elevation (NSTEMI) myocardial infarction: Secondary | ICD-10-CM | POA: Diagnosis not present

## 2019-03-19 DIAGNOSIS — G4730 Sleep apnea, unspecified: Secondary | ICD-10-CM | POA: Diagnosis not present

## 2019-03-23 DIAGNOSIS — M150 Primary generalized (osteo)arthritis: Secondary | ICD-10-CM | POA: Diagnosis not present

## 2019-03-23 DIAGNOSIS — G894 Chronic pain syndrome: Secondary | ICD-10-CM | POA: Diagnosis not present

## 2019-03-23 DIAGNOSIS — M4316 Spondylolisthesis, lumbar region: Secondary | ICD-10-CM | POA: Diagnosis not present

## 2019-03-23 DIAGNOSIS — M5416 Radiculopathy, lumbar region: Secondary | ICD-10-CM | POA: Diagnosis not present

## 2019-03-24 ENCOUNTER — Other Ambulatory Visit: Payer: Self-pay

## 2019-03-24 ENCOUNTER — Encounter: Payer: Self-pay | Admitting: Allergy and Immunology

## 2019-03-24 ENCOUNTER — Ambulatory Visit (INDEPENDENT_AMBULATORY_CARE_PROVIDER_SITE_OTHER): Payer: Medicare HMO | Admitting: Allergy and Immunology

## 2019-03-24 VITALS — HR 76 | Temp 97.3°F | Resp 18

## 2019-03-24 DIAGNOSIS — D839 Common variable immunodeficiency, unspecified: Secondary | ICD-10-CM | POA: Diagnosis not present

## 2019-03-24 DIAGNOSIS — D72810 Lymphocytopenia: Secondary | ICD-10-CM | POA: Diagnosis not present

## 2019-03-24 DIAGNOSIS — B029 Zoster without complications: Secondary | ICD-10-CM | POA: Diagnosis not present

## 2019-03-24 DIAGNOSIS — J449 Chronic obstructive pulmonary disease, unspecified: Secondary | ICD-10-CM

## 2019-03-24 DIAGNOSIS — K219 Gastro-esophageal reflux disease without esophagitis: Secondary | ICD-10-CM

## 2019-03-24 DIAGNOSIS — B3749 Other urogenital candidiasis: Secondary | ICD-10-CM | POA: Diagnosis not present

## 2019-03-24 DIAGNOSIS — I5022 Chronic systolic (congestive) heart failure: Secondary | ICD-10-CM | POA: Diagnosis not present

## 2019-03-24 DIAGNOSIS — I214 Non-ST elevation (NSTEMI) myocardial infarction: Secondary | ICD-10-CM | POA: Diagnosis not present

## 2019-03-24 DIAGNOSIS — G4734 Idiopathic sleep related nonobstructive alveolar hypoventilation: Secondary | ICD-10-CM

## 2019-03-24 DIAGNOSIS — M5430 Sciatica, unspecified side: Secondary | ICD-10-CM | POA: Diagnosis not present

## 2019-03-24 DIAGNOSIS — D62 Acute posthemorrhagic anemia: Secondary | ICD-10-CM | POA: Diagnosis not present

## 2019-03-24 DIAGNOSIS — G8929 Other chronic pain: Secondary | ICD-10-CM | POA: Diagnosis not present

## 2019-03-24 DIAGNOSIS — R0602 Shortness of breath: Secondary | ICD-10-CM | POA: Diagnosis not present

## 2019-03-24 DIAGNOSIS — J479 Bronchiectasis, uncomplicated: Secondary | ICD-10-CM

## 2019-03-24 DIAGNOSIS — Z48812 Encounter for surgical aftercare following surgery on the circulatory system: Secondary | ICD-10-CM | POA: Diagnosis not present

## 2019-03-24 DIAGNOSIS — G4730 Sleep apnea, unspecified: Secondary | ICD-10-CM | POA: Diagnosis not present

## 2019-03-24 NOTE — Progress Notes (Signed)
[place] [facility] [place] [facility] [place]   Follow-up Note  Referring Provider: Rochel Brome, MD Primary Provider: Rochel Brome, MD Date of Office Visit: 03/24/2019  Subjective:   Isaac Walsh (DOB: 05-05-1966) is a 53 y.o. male who returns to the Allergy and [place] on 03/24/2019 in re-evaluation of the following:  HPI: Adebayo presents to this clinic in reevaluation of his hypogammaglobulinemia, lymphopenia, inflammatory lung disease described as COPD with asthma and bronchiectasis, and a history of chronic Pseudomonas respiratory tract infection, untreated sleep apnea with nocturnal hypoxemia, and recent coronary artery disease requiring bypass surgery on 02 March 2019.  His last visit to this clinic was 20 September 2018.  During his last visit he still continued to have problems with recurrent infections of his airway and we increased his Privigen IVIG to 100 g every 3 weeks and since then he has actually done very well with his airway and it does not sound as though he has had any recurrent infections requiring prolonged antibiotic administration.  As well, his reflux was under very good control on his current therapy and overall he was doing relatively well.  He does not use his oxygen at nighttime even though he has been instructed to do so many times.  Apparently he underwent three-vessel bypass surgery on 02 March 2019 after sustaining a myocardial infarction.  Since that point in time he has been having some coughing and shortness of breath and dyspnea on exertion and he cannot sleep because of the symptoms.  Once again he has not restarted his oxygen.  It does not sound as though he has had any imaging performed after his surgery of his heart or airway.  He has not had any swelling of his legs other than that his saphenous vein harvesting site.  Allergies as of 03/24/2019      Reactions   Cephalosporins Hives   Other reaction(s): Hives   Corticosteroids Hives   Quinolones Hives   Other reaction(s): Hives Other reaction(s): Hives   Betamethasone Rash, Hives   Other reaction(s): Hives, Rash   Cefaclor Dermatitis, Rash, Hives   Other reaction(s): Hives, Rash   Ceftriaxone Hives, Rash, Dermatitis   Other reaction(s): Hives, Rash   Ketorolac Other (See Comments), Rash, Hives   Other reaction(s): Hives, Rash Manic symptoms   Loratadine Dermatitis, Rash, Hives   Other reaction(s): Hives, Rash   Naproxen Rash, Hives   Other reaction(s): Hives, Rash Looks like shingles   Androgel Pump [testosterone]    rash   Avelox [moxifloxacin]    Blisters and "doesn't work for me" per pt   Effexor [venlafaxine]    Flumist [flu Virus Vaccine]    Pt reports he can tolerate flu shot but not mist   Other    Tizanidine    Toradol [ketorolac Tromethamine]    Vicodin [hydrocodone-acetaminophen] Nausea Only      Medication List      acyclovir 400 MG tablet Commonly known as:  ZOVIRAX Take 400 mg by mouth 5 (five) times daily. As needed   albuterol 108 (90 Base) MCG/ACT inhaler Commonly known as:  ProAir HFA Inhale two puffs every four to six hours as needed for cough or wheeze.   ARIPiprazole 15 MG tablet Commonly known as:  ABILIFY Take 15 mg by mouth daily.   budesonide-formoterol 160-4.5 MCG/ACT inhaler Commonly known as:  Symbicort Inhale two puffs twice daily to prevent cough or wheeze. Rinse mouth after use.   carisoprodol 350 MG  tablet Commonly known as:  SOMA Take 350 mg by mouth every 6 (six) hours as needed for muscle spasms.   clotrimazole-betamethasone cream Commonly known as:  LOTRISONE APPLY A SMALL AMOUNT OF CREAM TO AFFECTED AREA(S) TWICE DAILY   EPINEPHrine 0.3 mg/0.3 mL Soaj injection Commonly known as:  EPI-PEN Use as directed for life-threatening allergic reaction.   fenofibrate micronized 134 MG capsule Commonly known as:  LOFIBRA Take 134 mg by mouth daily.   finasteride 1 MG tablet Commonly  known as:  PROPECIA Take 1 mg by mouth daily.   gabapentin 300 MG capsule Commonly known as:  NEURONTIN TAKE 1 CAPSULE (300 MG TOTAL) BY MOUTH 3 TIMES DAILY.   Hematinic Plus Complex 106-1 MG Tabs Take 1 capsule by mouth 2 (two) times daily.   Immune Globulin 10% 10 GM/100ML Soln Generic drug:  Immune Globulin 10%   ipratropium-albuterol 0.5-2.5 (3) MG/3ML Soln Commonly known as:  DUONEB USE 1 VIAL IN NEBULIZER EVERY 4 TO 6 HOURS AS NEEDED FOR COUGH OR WHEEZING   levothyroxine 100 MCG tablet Commonly known as:  SYNTHROID, LEVOTHROID Take 100 mcg by mouth daily before breakfast.   meloxicam 7.5 MG tablet Commonly known as:  MOBIC TAKE 1 TABLET BY MOUTH ONCE TO TWICE DAILY AS NEEDED FOR PAIN TENDONITIS   montelukast 10 MG tablet Commonly known as:  SINGULAIR Take 10 mg by mouth at bedtime.   nitroGLYCERIN 0.4 MG SL tablet Commonly known as:  NITROSTAT Place 0.4 mg under the tongue as needed.   omeprazole 40 MG capsule Commonly known as:  PRILOSEC TAKE 1 CAPSULE BY MOUTH TWICE DAILY.   oxyCODONE-acetaminophen 10-325 MG tablet Commonly known as:  PERCOCET Take 1 tablet by mouth 4 (four) times daily.   Ranexa 1000 MG SR tablet Generic drug:  ranolazine Take 1,000 mg by mouth 2 (two) times daily.   rosuvastatin 5 MG tablet Commonly known as:  CRESTOR Take 5 mg by mouth daily.   sertraline 100 MG tablet Commonly known as:  ZOLOFT Take 100 mg by mouth. Take 1 1/2 tablets by mouth   SYSTANE OP Apply to eye every 4 (four) hours.   traZODone 100 MG tablet Commonly known as:  DESYREL Take 200 mg by mouth at bedtime.   Vascepa 1 g Caps Generic drug:  Icosapent Ethyl TAKE 2 CAPSULES BY MOUTH TWICE DAILY WITH FOOD SWALLOW WHOLE. DO NOT CHEW OPEN DISSOLVE AND OR CRUSH       Past Medical History:  Diagnosis Date  . Abnormal heart rhythm   . Asthma   . Chronic pain syndrome   . Common variable immunodeficiency ([place])   . Congenital dysgammaglobulinemia ([place])   .  COPD (chronic obstructive pulmonary disease) ([place])   . Depression with anxiety   . Fatigue   . GERD (gastroesophageal reflux disease)   . Hypercholesterolemia   . Hypothyroidism   . Lymphoma ([place])    as a child  . Nonalcoholic fatty liver disease   . OSA (obstructive sleep apnea)   . Osgood-Schlatter's disease   . Other nonspecific abnormal finding of lung field   . Plantar fasciitis   . Renal stones   . Sciatica   . SCID (severe combined immunodeficiency disease) ([place])   . Trigger finger   . Unspecified deficiency anemia   . Xerostomia     Past Surgical History:  Procedure Laterality Date  . LIVER SURGERY     10% of liver removed  . LYMPHADENECTOMY    . sinusectomy    .  TYMPANOSTOMY TUBE PLACEMENT      Review of systems negative except as noted in HPI / PMHx or noted below:  Review of Systems  Constitutional: Negative.   HENT: Negative.   Eyes: Negative.   Respiratory: Negative.   Cardiovascular: Negative.   Gastrointestinal: Negative.   Genitourinary: Negative.   Musculoskeletal: Negative.   Skin: Negative.   Neurological: Negative.   Endo/Heme/Allergies: Negative.   Psychiatric/Behavioral: Negative.      Objective:   Vitals:   03/24/19 1638  Pulse: 76  Resp: 18  Temp: (!) 97.3 F (36.3 C)  SpO2: 93%          Physical Exam Constitutional:      Appearance: He is not diaphoretic.     Comments: Pale appearance  HENT:     Head: Normocephalic.     Right Ear: Ear canal and external ear normal. Tympanic membrane is scarred.     Left Ear: Ear canal and external ear normal. Tympanic membrane is scarred.     Nose: Nose normal. No mucosal edema (Septal perforation) or rhinorrhea.     Mouth/Throat:     Pharynx: Uvula midline. No oropharyngeal exudate.  Eyes:     Conjunctiva/sclera: Conjunctivae normal.  Neck:     Thyroid: No thyromegaly.     Trachea: Trachea normal. No tracheal tenderness or tracheal deviation.  Cardiovascular:     Rate and Rhythm:  Normal rate and regular rhythm.     Heart sounds: Normal heart sounds, S1 normal and S2 normal. No murmur.  Pulmonary:     Effort: No respiratory distress.     Breath sounds: Normal breath sounds. No stridor. No wheezing or rales.  Lymphadenopathy:     Head:     Right side of head: No tonsillar adenopathy.     Left side of head: No tonsillar adenopathy.     Cervical: No cervical adenopathy.  Skin:    Findings: No erythema or rash.     Nails: There is no clubbing.   Neurological:     Mental Status: He is alert.     Diagnostics:    Spirometry was not performed    Assessment and Plan:   1. CVID (common variable immunodeficiency) ([place])   2. Bronchiectasis without complication ([place])   3. COPD with asthma ([place])   4. Gastroesophageal reflux disease, esophagitis presence not specified   5. Nocturnal hypoxemia   6. Lymphopenia   7. Shortness of breath   8. Chronic systolic congestive heart failure ([place])     1. Continue Privagen infusions at 100G every 3 weeks.   2.  Continue Symbicort 160 -2 inhalations 2 times per day  3. Continue Montelukast 68m one tablet one time per day   4. Continue Omeprazole 40 twice a day  5. Use bronchodilator (Duoneb) or Proair HFA 2 puffs every 4-6 hours if needed.  6.  Use your oxygen at night-time  7.  Obtain a chest x-ray   8.  [facility] [place] 9. Obtain a blood test - CBC w/diff, IgG, BNP  10. Further evaluation?   11. Return in 12 weeks or earlier if problem.  Concerning Christophers airway issue he is really doing very well and that improvement appears to correlate with increasing his dose of intravenous immunoglobulin to 100 g every 3 weeks.  We will keep him on this dose at this point in time and check his serum IgG level.  He has coughing and dyspnea on  exertion from some unknown source ever since his surgery.  I think we are obligated to image his chest with a chest x-ray and obtain a echo and  make sure were not dealing with some postsurgical issue or a poor systolic function although on today's exam there did not really appear to be evidence of significant congestive heart failure.  He is very pale and we will check a CBC with differential and see if he is anemic.  He will continue on a large collection of medical therapy directed against respiratory tract inflammation and reflux and of course continue on immunoglobulin infusions.  I will contact him with the results of his tesst once they are available for review and we will pass these test results onto his cardiologist.  Allena Katz, MD Allergy / [place]

## 2019-03-24 NOTE — Patient Instructions (Addendum)
  1. Continue Privagen infusions at 100G every 3 weeks.   2.  Continue Symbicort 160 -2 inhalations 2 times per day  3. Continue Montelukast 11m one tablet one time per day   4. Continue Omeprazole 40 twice a day  5. Use bronchodilator (Duoneb) or Proair HFA 2 puffs every 4-6 hours if needed.  6.  Use your oxygen at night-time  7.  Obtain a chest x-ray   8.  [facility] [place] 9. Obtain a blood test - CBC w/diff, IgG, BNP  10. Further evaluation?   11. Return in 12 weeks or earlier if problem.

## 2019-03-25 ENCOUNTER — Telehealth: Payer: Self-pay | Admitting: *Deleted

## 2019-03-25 DIAGNOSIS — J449 Chronic obstructive pulmonary disease, unspecified: Secondary | ICD-10-CM | POA: Diagnosis not present

## 2019-03-25 DIAGNOSIS — R0602 Shortness of breath: Secondary | ICD-10-CM | POA: Diagnosis not present

## 2019-03-25 DIAGNOSIS — I5022 Chronic systolic (congestive) heart failure: Secondary | ICD-10-CM | POA: Diagnosis not present

## 2019-03-25 DIAGNOSIS — D839 Common variable immunodeficiency, unspecified: Secondary | ICD-10-CM | POA: Diagnosis not present

## 2019-03-25 NOTE — Telephone Encounter (Signed)
Left a message with the front desk regarding Dr. Bruna Potter request for Isaac Walsh to have an ECHO. A nurse will return my call.

## 2019-03-25 NOTE — Telephone Encounter (Signed)
Cardio is referring Mats back to CT surgery. I will leave notes with Dr. Neldon Mc.

## 2019-03-28 ENCOUNTER — Other Ambulatory Visit: Payer: Self-pay | Admitting: *Deleted

## 2019-03-28 ENCOUNTER — Encounter: Payer: Self-pay | Admitting: Allergy and Immunology

## 2019-03-28 ENCOUNTER — Telehealth: Payer: Self-pay

## 2019-03-28 DIAGNOSIS — J449 Chronic obstructive pulmonary disease, unspecified: Secondary | ICD-10-CM

## 2019-03-28 DIAGNOSIS — D839 Common variable immunodeficiency, unspecified: Secondary | ICD-10-CM

## 2019-03-28 DIAGNOSIS — D649 Anemia, unspecified: Secondary | ICD-10-CM

## 2019-03-28 MED ORDER — FUROSEMIDE 40 MG PO TABS: ORAL_TABLET | ORAL | 1 refills | Status: DC

## 2019-03-28 NOTE — Telephone Encounter (Signed)
Isaac Walsh called in requesting the results of his lab work. He stated he doesn't feel well at all. I printed off lab results and gave them to Dr. Neldon Mc for him to review and he stated he would contact his cardiologist.

## 2019-03-31 DIAGNOSIS — J449 Chronic obstructive pulmonary disease, unspecified: Secondary | ICD-10-CM | POA: Diagnosis not present

## 2019-03-31 DIAGNOSIS — D649 Anemia, unspecified: Secondary | ICD-10-CM | POA: Diagnosis not present

## 2019-03-31 DIAGNOSIS — D839 Common variable immunodeficiency, unspecified: Secondary | ICD-10-CM | POA: Diagnosis not present

## 2019-04-01 DIAGNOSIS — Z48812 Encounter for surgical aftercare following surgery on the circulatory system: Secondary | ICD-10-CM | POA: Diagnosis not present

## 2019-04-01 DIAGNOSIS — I214 Non-ST elevation (NSTEMI) myocardial infarction: Secondary | ICD-10-CM | POA: Diagnosis not present

## 2019-04-01 DIAGNOSIS — B029 Zoster without complications: Secondary | ICD-10-CM | POA: Diagnosis not present

## 2019-04-01 DIAGNOSIS — J449 Chronic obstructive pulmonary disease, unspecified: Secondary | ICD-10-CM | POA: Diagnosis not present

## 2019-04-01 DIAGNOSIS — M5430 Sciatica, unspecified side: Secondary | ICD-10-CM | POA: Diagnosis not present

## 2019-04-01 DIAGNOSIS — B3749 Other urogenital candidiasis: Secondary | ICD-10-CM | POA: Diagnosis not present

## 2019-04-01 DIAGNOSIS — D62 Acute posthemorrhagic anemia: Secondary | ICD-10-CM | POA: Diagnosis not present

## 2019-04-01 DIAGNOSIS — G4730 Sleep apnea, unspecified: Secondary | ICD-10-CM | POA: Diagnosis not present

## 2019-04-01 DIAGNOSIS — G8929 Other chronic pain: Secondary | ICD-10-CM | POA: Diagnosis not present

## 2019-04-07 ENCOUNTER — Other Ambulatory Visit: Payer: Self-pay | Admitting: Allergy and Immunology

## 2019-04-07 NOTE — Telephone Encounter (Signed)
Prescription refill has been sent

## 2019-04-08 DIAGNOSIS — J449 Chronic obstructive pulmonary disease, unspecified: Secondary | ICD-10-CM | POA: Diagnosis not present

## 2019-04-08 DIAGNOSIS — I214 Non-ST elevation (NSTEMI) myocardial infarction: Secondary | ICD-10-CM | POA: Diagnosis not present

## 2019-04-08 DIAGNOSIS — Z48812 Encounter for surgical aftercare following surgery on the circulatory system: Secondary | ICD-10-CM | POA: Diagnosis not present

## 2019-04-12 ENCOUNTER — Encounter: Payer: Self-pay | Admitting: Allergy and Immunology

## 2019-04-18 DIAGNOSIS — R0602 Shortness of breath: Secondary | ICD-10-CM | POA: Diagnosis not present

## 2019-04-18 DIAGNOSIS — R072 Precordial pain: Secondary | ICD-10-CM | POA: Diagnosis not present

## 2019-04-19 DIAGNOSIS — J449 Chronic obstructive pulmonary disease, unspecified: Secondary | ICD-10-CM | POA: Diagnosis not present

## 2019-04-20 DIAGNOSIS — B370 Candidal stomatitis: Secondary | ICD-10-CM | POA: Diagnosis not present

## 2019-04-20 DIAGNOSIS — J208 Acute bronchitis due to other specified organisms: Secondary | ICD-10-CM | POA: Diagnosis not present

## 2019-04-21 DIAGNOSIS — M47817 Spondylosis without myelopathy or radiculopathy, lumbosacral region: Secondary | ICD-10-CM | POA: Diagnosis not present

## 2019-04-21 DIAGNOSIS — M4316 Spondylolisthesis, lumbar region: Secondary | ICD-10-CM | POA: Diagnosis not present

## 2019-04-21 DIAGNOSIS — G894 Chronic pain syndrome: Secondary | ICD-10-CM | POA: Diagnosis not present

## 2019-04-21 DIAGNOSIS — M150 Primary generalized (osteo)arthritis: Secondary | ICD-10-CM | POA: Diagnosis not present

## 2019-04-22 DIAGNOSIS — B3749 Other urogenital candidiasis: Secondary | ICD-10-CM | POA: Diagnosis not present

## 2019-04-22 DIAGNOSIS — G4730 Sleep apnea, unspecified: Secondary | ICD-10-CM | POA: Diagnosis not present

## 2019-04-22 DIAGNOSIS — Z48812 Encounter for surgical aftercare following surgery on the circulatory system: Secondary | ICD-10-CM | POA: Diagnosis not present

## 2019-04-22 DIAGNOSIS — M5430 Sciatica, unspecified side: Secondary | ICD-10-CM | POA: Diagnosis not present

## 2019-04-22 DIAGNOSIS — J441 Chronic obstructive pulmonary disease with (acute) exacerbation: Secondary | ICD-10-CM | POA: Diagnosis not present

## 2019-04-22 DIAGNOSIS — G894 Chronic pain syndrome: Secondary | ICD-10-CM | POA: Diagnosis not present

## 2019-04-22 DIAGNOSIS — G8929 Other chronic pain: Secondary | ICD-10-CM | POA: Diagnosis not present

## 2019-04-22 DIAGNOSIS — B029 Zoster without complications: Secondary | ICD-10-CM | POA: Diagnosis not present

## 2019-04-22 DIAGNOSIS — I214 Non-ST elevation (NSTEMI) myocardial infarction: Secondary | ICD-10-CM | POA: Diagnosis not present

## 2019-04-22 DIAGNOSIS — D62 Acute posthemorrhagic anemia: Secondary | ICD-10-CM | POA: Diagnosis not present

## 2019-04-22 DIAGNOSIS — J449 Chronic obstructive pulmonary disease, unspecified: Secondary | ICD-10-CM | POA: Diagnosis not present

## 2019-04-26 DIAGNOSIS — R072 Precordial pain: Secondary | ICD-10-CM | POA: Diagnosis not present

## 2019-05-06 DIAGNOSIS — J449 Chronic obstructive pulmonary disease, unspecified: Secondary | ICD-10-CM | POA: Diagnosis not present

## 2019-05-06 DIAGNOSIS — G4730 Sleep apnea, unspecified: Secondary | ICD-10-CM | POA: Diagnosis not present

## 2019-05-06 DIAGNOSIS — B3749 Other urogenital candidiasis: Secondary | ICD-10-CM | POA: Diagnosis not present

## 2019-05-06 DIAGNOSIS — I214 Non-ST elevation (NSTEMI) myocardial infarction: Secondary | ICD-10-CM | POA: Diagnosis not present

## 2019-05-06 DIAGNOSIS — B029 Zoster without complications: Secondary | ICD-10-CM | POA: Diagnosis not present

## 2019-05-06 DIAGNOSIS — M5430 Sciatica, unspecified side: Secondary | ICD-10-CM | POA: Diagnosis not present

## 2019-05-06 DIAGNOSIS — Z48812 Encounter for surgical aftercare following surgery on the circulatory system: Secondary | ICD-10-CM | POA: Diagnosis not present

## 2019-05-06 DIAGNOSIS — D62 Acute posthemorrhagic anemia: Secondary | ICD-10-CM | POA: Diagnosis not present

## 2019-05-06 DIAGNOSIS — G8929 Other chronic pain: Secondary | ICD-10-CM | POA: Diagnosis not present

## 2019-05-10 DIAGNOSIS — R05 Cough: Secondary | ICD-10-CM | POA: Diagnosis not present

## 2019-05-10 DIAGNOSIS — J449 Chronic obstructive pulmonary disease, unspecified: Secondary | ICD-10-CM | POA: Diagnosis not present

## 2019-05-11 DIAGNOSIS — R05 Cough: Secondary | ICD-10-CM | POA: Diagnosis not present

## 2019-05-11 DIAGNOSIS — D832 Common variable immunodeficiency with autoantibodies to B- or T-cells: Secondary | ICD-10-CM | POA: Diagnosis not present

## 2019-05-11 DIAGNOSIS — J441 Chronic obstructive pulmonary disease with (acute) exacerbation: Secondary | ICD-10-CM | POA: Diagnosis not present

## 2019-05-11 DIAGNOSIS — J189 Pneumonia, unspecified organism: Secondary | ICD-10-CM | POA: Diagnosis not present

## 2019-05-13 DIAGNOSIS — D649 Anemia, unspecified: Secondary | ICD-10-CM | POA: Diagnosis not present

## 2019-05-13 DIAGNOSIS — R0602 Shortness of breath: Secondary | ICD-10-CM | POA: Diagnosis not present

## 2019-05-13 DIAGNOSIS — E782 Mixed hyperlipidemia: Secondary | ICD-10-CM | POA: Diagnosis not present

## 2019-05-13 DIAGNOSIS — R7301 Impaired fasting glucose: Secondary | ICD-10-CM | POA: Diagnosis not present

## 2019-05-17 DIAGNOSIS — Z0001 Encounter for general adult medical examination with abnormal findings: Secondary | ICD-10-CM | POA: Diagnosis not present

## 2019-05-17 DIAGNOSIS — Z125 Encounter for screening for malignant neoplasm of prostate: Secondary | ICD-10-CM | POA: Diagnosis not present

## 2019-05-17 DIAGNOSIS — E440 Moderate protein-calorie malnutrition: Secondary | ICD-10-CM | POA: Diagnosis not present

## 2019-05-17 DIAGNOSIS — Z6825 Body mass index (BMI) 25.0-25.9, adult: Secondary | ICD-10-CM | POA: Diagnosis not present

## 2019-05-18 ENCOUNTER — Encounter: Payer: Self-pay | Admitting: Allergy and Immunology

## 2019-05-18 ENCOUNTER — Other Ambulatory Visit: Payer: Self-pay

## 2019-05-18 ENCOUNTER — Ambulatory Visit (INDEPENDENT_AMBULATORY_CARE_PROVIDER_SITE_OTHER): Payer: Medicare HMO | Admitting: Allergy and Immunology

## 2019-05-18 VITALS — BP 110/66 | HR 86 | Temp 97.5°F | Resp 18

## 2019-05-18 DIAGNOSIS — D72810 Lymphocytopenia: Secondary | ICD-10-CM

## 2019-05-18 DIAGNOSIS — K219 Gastro-esophageal reflux disease without esophagitis: Secondary | ICD-10-CM | POA: Diagnosis not present

## 2019-05-18 DIAGNOSIS — A498 Other bacterial infections of unspecified site: Secondary | ICD-10-CM

## 2019-05-18 DIAGNOSIS — J988 Other specified respiratory disorders: Secondary | ICD-10-CM | POA: Diagnosis not present

## 2019-05-18 DIAGNOSIS — J449 Chronic obstructive pulmonary disease, unspecified: Secondary | ICD-10-CM | POA: Diagnosis not present

## 2019-05-18 DIAGNOSIS — R0602 Shortness of breath: Secondary | ICD-10-CM | POA: Diagnosis not present

## 2019-05-18 DIAGNOSIS — D839 Common variable immunodeficiency, unspecified: Secondary | ICD-10-CM | POA: Diagnosis not present

## 2019-05-18 NOTE — Progress Notes (Signed)
[place]   Follow-up Note  Referring Provider: Rochel Brome, MD Primary Provider: Rochel Brome, MD Date of Office Visit: 05/18/2019  Subjective:   Isaac Walsh (DOB: July 04, 1966) is a 53 y.o. male who returns to the Allergy and [place] on 05/18/2019 in re-evaluation of the following:  HPI: Lorne presents to this clinic in reevaluation of hypogammaglobulinemia, lymphopenia, and inflammatory lung disease and a history of chronic Pseudomonas respiratory tract colonization, and untreated sleep apnea with nocturnal hypoxemia, and a history of coronary artery disease requiring bypass past surgery March 2020.  I last saw him in this clinic on 24 March 2019.  He presents today stating that he has been coughing for months and has been hacking for months and he cannot sleep because of all the hacking cough and then he can't sleep in general.  As well, he has swallowing problems which has been an issue since his surgery.  He finds it very hard to swallow and he is having difficulty swallowing some of his pills.  In addition, this week he has left-sided chest pain.  Most recently his pulmonologist, Dr. Wille Celeste, has placed him on azithromycin on a daily basis aiming for 1 year of therapy.  As well, he presented to his primary care doctor in April and was treated with Augmentin and prednisone and most recently he presented once again to his primary care doctor who treated him with Levaquin which he just finished today.  Allergies as of 05/18/2019      Reactions   Cephalosporins Hives   Other reaction(s): Hives   Corticosteroids Hives   Quinolones Hives   Other reaction(s): Hives Other reaction(s): Hives   Betamethasone Rash, Hives   Other reaction(s): Hives, Rash   Cefaclor Dermatitis, Rash, Hives   Other reaction(s): Hives, Rash   Ceftriaxone Hives, Rash, Dermatitis   Other reaction(s): Hives, Rash   Ketorolac Other (See  Comments), Rash, Hives   Other reaction(s): Hives, Rash Manic symptoms   Loratadine Dermatitis, Rash, Hives   Other reaction(s): Hives, Rash   Naproxen Rash, Hives   Other reaction(s): Hives, Rash Looks like shingles   Androgel Pump [testosterone]    rash   Avelox [moxifloxacin]    Blisters and "doesn't work for me" per pt   Effexor [venlafaxine]    Flumist [flu Virus Vaccine]    Pt reports he can tolerate flu shot but not mist   Other    Tizanidine    Toradol [ketorolac Tromethamine]    Vicodin [hydrocodone-acetaminophen] Nausea Only      Medication List    acyclovir 400 MG tablet Commonly known as:  ZOVIRAX Take 400 mg by mouth 5 (five) times daily. As needed   albuterol 108 (90 Base) MCG/ACT inhaler Commonly known as:  ProAir HFA Inhale two puffs every four to six hours as needed for cough or wheeze.   ARIPiprazole 15 MG tablet Commonly known as:  ABILIFY Take 15 mg by mouth daily.   azithromycin 250 MG tablet Commonly known as:  ZITHROMAX Take by mouth.   budesonide-formoterol 160-4.5 MCG/ACT inhaler Commonly known as:  Symbicort Inhale two puffs twice daily to prevent cough or wheeze. Rinse mouth after use.   clotrimazole-betamethasone cream Commonly known as:  LOTRISONE APPLY A SMALL AMOUNT OF CREAM TO AFFECTED AREA(S) TWICE DAILY   EPINEPHrine 0.3 mg/0.3 mL Soaj injection Commonly known as:  EPI-PEN Use as directed for life-threatening allergic reaction.   fenofibrate micronized 134 MG  capsule Commonly known as:  LOFIBRA Take 134 mg by mouth daily.   finasteride 1 MG tablet Commonly known as:  PROPECIA Take 1 mg by mouth daily.   furosemide 40 MG tablet Commonly known as:  Lasix Take one tablet once daily   gabapentin 300 MG capsule Commonly known as:  NEURONTIN TAKE 1 CAPSULE (300 MG TOTAL) BY MOUTH 3 TIMES DAILY.   Hematinic Plus Complex 106-1 MG Tabs Take 1 capsule by mouth 2 (two) times daily.   Immune Globulin 10% 10G/18m  (10,0052m100mL) Soln Generic drug:  Immune Globulin 10%   ipratropium-albuterol 0.5-2.5 (3) MG/3ML Soln Commonly known as:  DUONEB USE 1 VIAL IN NEBULIZER EVERY 4 TO 6 HOURS AS NEEDED FOR COUGH OR WHEEZING   levothyroxine 100 MCG tablet Commonly known as:  SYNTHROID Take 100 mcg by mouth daily before breakfast.   meloxicam 7.5 MG tablet Commonly known as:  MOBIC TAKE 1 TABLET BY MOUTH ONCE TO TWICE DAILY AS NEEDED FOR PAIN TENDONITIS   montelukast 10 MG tablet Commonly known as:  SINGULAIR Take 10 mg by mouth at bedtime.   nitroGLYCERIN 0.4 MG SL tablet Commonly known as:  NITROSTAT Place 0.4 mg under the tongue as needed.   omeprazole 40 MG capsule Commonly known as:  PRILOSEC Take 1 capsule by mouth twice daily   oxyCODONE-acetaminophen 10-325 MG tablet Commonly known as:  PERCOCET Take 1 tablet by mouth 4 (four) times daily.   Ranexa 1000 MG SR tablet Generic drug:  ranolazine Take 1,000 mg by mouth 2 (two) times daily.   rosuvastatin 5 MG tablet Commonly known as:  CRESTOR Take 5 mg by mouth daily.   sertraline 100 MG tablet Commonly known as:  ZOLOFT Take 100 mg by mouth. Take 1 1/2 tablets by mouth   SYSTANE OP Apply to eye every 4 (four) hours.   traZODone 100 MG tablet Commonly known as:  DESYREL Take 200 mg by mouth at bedtime.   Vascepa 1 g Caps Generic drug:  Icosapent Ethyl TAKE 2 CAPSULES BY MOUTH TWICE DAILY WITH FOOD SWALLOW WHOLE. DO NOT CHEW OPEN DISSOLVE AND OR CRUSH       Past Medical History:  Diagnosis Date  . Abnormal heart rhythm   . Asthma   . Chronic pain syndrome   . Common variable immunodeficiency ([place]  . Congenital dysgammaglobulinemia ([place]  . COPD (chronic obstructive pulmonary disease) ([place]  . Depression with anxiety   . Fatigue   . GERD (gastroesophageal reflux disease)   . Hypercholesterolemia   . Hypothyroidism   . Lymphoma ([place]   as a child  . Nonalcoholic fatty liver disease   . OSA (obstructive  sleep apnea)   . Osgood-Schlatter's disease   . Other nonspecific abnormal finding of lung field   . Plantar fasciitis   . Renal stones   . Sciatica   . SCID (severe combined immunodeficiency disease) ([place]  . Trigger finger   . Unspecified deficiency anemia   . Xerostomia     Past Surgical History:  Procedure Laterality Date  . LIVER SURGERY     10% of liver removed  . LYMPHADENECTOMY    . sinusectomy    . TYMPANOSTOMY TUBE PLACEMENT      Review of systems negative except as noted in HPI / PMHx or noted below:  Review of Systems  Constitutional: Negative.   HENT: Negative.   Eyes: Negative.   Respiratory: Negative.   Cardiovascular: Negative.   Gastrointestinal:  Negative.   Genitourinary: Negative.   Musculoskeletal: Negative.   Skin: Negative.   Neurological: Negative.   Endo/Heme/Allergies: Negative.   Psychiatric/Behavioral: Negative.      Objective:   Vitals:   05/18/19 1142 05/18/19 1238  BP: 110/66   Pulse: 86   Resp: 18   Temp: (!) 97.5 F (36.4 C)   SpO2: (!) 83% 96%          Physical Exam Constitutional:      Appearance: He is not diaphoretic.  HENT:     Head: Normocephalic.     Right Ear: Tympanic membrane, ear canal and external ear normal.     Left Ear: Tympanic membrane, ear canal and external ear normal.     Nose: Nose normal. No mucosal edema (Septal perforation) or rhinorrhea.     Mouth/Throat:     Pharynx: Uvula midline. No oropharyngeal exudate.  Eyes:     Conjunctiva/sclera: Conjunctivae normal.  Neck:     Thyroid: No thyromegaly.     Trachea: Trachea normal. No tracheal tenderness or tracheal deviation.  Cardiovascular:     Rate and Rhythm: Normal rate and regular rhythm.     Heart sounds: S1 normal and S2 normal. Murmur (Systolic and diastolic murmur) present.  Pulmonary:     Effort: No respiratory distress.     Breath sounds: No stridor. Wheezing (Inspiratory and expiratory wheezing and rhonchi left posterior chest)  present. No rales.  Lymphadenopathy:     Head:     Right side of head: No tonsillar adenopathy.     Left side of head: No tonsillar adenopathy.     Cervical: No cervical adenopathy.  Skin:    Findings: No erythema or rash.     Nails: There is no clubbing.   Neurological:     Mental Status: He is alert.     Diagnostics:    Oxygen saturation on room air at rest was 83%.  With 2 L nasal cannula oxygen his oxygen saturation at rest rose to 96%.  A chest x-ray report obtained 10 May 2019 at [facility] identifies her persistent hazy diffuse bilateral airspace opacities and mild cardiomegaly.  Results of blood tests obtained 13 May 2019 identified NT-proBNP 1674 PG/mL  Results of blood tests obtained 31 March 2019 identified WBC 14.1, absolute lymphocyte count 560, neutrophilia, hemoglobin 9.7, platelet 454, ferritin 57.6.  Results of blood tests obtained 25 March 2019 identified serum IgG 1032 mg/DL, NT-proBNP 1550 PG/mL  Results of an echocardiogram obtained 26 April 2019 identified the following:  Normal mitral valve structure and mobility. No significant regurgitation. Structurally normal aortic valve with good leaflet mobility, and no regurgitation. Tricuspid valve is structurally normal. Mild tricuspid regurgitation. RVSP 38 mm Hg. Normal size left atrium. Ejection fraction is visually estimated at 55% septal motion c/w post operative state unable to determine diastolic function no significant change c/w prior echo mildly dilated right atrium Normal right ventricle structure and function. The aorta is within normal limits. The Pulmonary artery is within normal limits. Intact interatrial septum with no obvious shunt by color doppler. IVC is normal and collapses No evidence of pericardial effusion.  Assessment and Plan:   1. CVID (common variable immunodeficiency) (HCC)   2. Lymphopenia   3. COPD with asthma ([place])   4. Pseudomonas respiratory infection   5.  Gastroesophageal reflux disease, esophagitis presence not specified     1. Continue Privagen infusions at 100G every 3 weeks.   2.  Continue Symbicort 160 -2 inhalations  2 times per day  3. Continue Montelukast 50m one tablet one time per day   4. Continue Omeprazole 40 twice a day  5. Use bronchodilator (Duoneb) or Proair HFA 2 puffs every 4-6 hours if needed.  6.  Use 2 L nasal oxygen 24 hours a day  7.  Obtain a high resolution non-contrast chest CT scan ASAP   8.  Follow-up with cardiology visit today  9.  Visit with pulmonologist ASAP   10. Return in 12 weeks or earlier if problem.  CTadariusappears to have a little more activity of his chronic lung disease in association with very significant hypoxemia and new onset murmur and evidence of significant left heart strain that he has had in the past and he has apparently not responded to recent broad-spectrum antibiotics and systemic steroid administration.  At this point I am attempting to contact his cardiologist and his pulmonologist regarding further management of this issue.  He does have an appointment with his cardiologist at 230 this afternoon which is very fortuitous given his current status.  I suspect that he will require a bronchoscopy to determine what is going on down in his lung as he is not responding to multiple forms of therapy directed against infection and inflammation.  I have left a telephone message with both his cardiologist and pulmonologist asking them to contact me today regarding Che's current status.  CRubenalso has a swallowing issue that has been present since surgery and I wonder if he has some type of anatomical or neurological defect as a result of that surgery preventing him from having good swallowing mechanics.  This is an issue that we can look at at some point after we work through some of these other issues.  He will probably require a modified barium swallow to look into this issue.   EAllena Katz MD Allergy / Immunology [place]

## 2019-05-18 NOTE — Patient Instructions (Addendum)
  1. Continue Privagen infusions at 100G every 3 weeks.   2.  Continue Symbicort 160 -2 inhalations 2 times per day  3. Continue Montelukast 11m one tablet one time per day   4. Continue Omeprazole 40 twice a day  5. Use bronchodilator (Duoneb) or Proair HFA 2 puffs every 4-6 hours if needed.  6.  Use 2 L nasal oxygen 24 hours a day  7.  Obtain a high resolution non-contrast chest CT scan ASAP  8.  Follow-up with cardiology visit today  9.  Visit with pulmonologist ASAP   10. Return in 12 weeks or earlier if problem.

## 2019-05-19 DIAGNOSIS — M47817 Spondylosis without myelopathy or radiculopathy, lumbosacral region: Secondary | ICD-10-CM | POA: Diagnosis not present

## 2019-05-19 DIAGNOSIS — M8949 Other hypertrophic osteoarthropathy, multiple sites: Secondary | ICD-10-CM | POA: Diagnosis not present

## 2019-05-19 DIAGNOSIS — J449 Chronic obstructive pulmonary disease, unspecified: Secondary | ICD-10-CM | POA: Diagnosis not present

## 2019-05-19 DIAGNOSIS — G894 Chronic pain syndrome: Secondary | ICD-10-CM | POA: Diagnosis not present

## 2019-05-24 DIAGNOSIS — R0902 Hypoxemia: Secondary | ICD-10-CM | POA: Diagnosis not present

## 2019-05-24 DIAGNOSIS — A310 Pulmonary mycobacterial infection: Secondary | ICD-10-CM | POA: Diagnosis not present

## 2019-05-24 DIAGNOSIS — J479 Bronchiectasis, uncomplicated: Secondary | ICD-10-CM | POA: Diagnosis not present

## 2019-05-24 DIAGNOSIS — R911 Solitary pulmonary nodule: Secondary | ICD-10-CM | POA: Diagnosis not present

## 2019-05-24 DIAGNOSIS — I700 Atherosclerosis of aorta: Secondary | ICD-10-CM | POA: Diagnosis not present

## 2019-05-24 DIAGNOSIS — J679 Hypersensitivity pneumonitis due to unspecified organic dust: Secondary | ICD-10-CM | POA: Diagnosis not present

## 2019-05-26 ENCOUNTER — Encounter: Payer: Self-pay | Admitting: *Deleted

## 2019-05-27 DIAGNOSIS — Z87891 Personal history of nicotine dependence: Secondary | ICD-10-CM | POA: Diagnosis not present

## 2019-05-27 DIAGNOSIS — R05 Cough: Secondary | ICD-10-CM | POA: Diagnosis not present

## 2019-05-27 DIAGNOSIS — R9389 Abnormal findings on diagnostic imaging of other specified body structures: Secondary | ICD-10-CM | POA: Diagnosis not present

## 2019-05-27 DIAGNOSIS — J479 Bronchiectasis, uncomplicated: Secondary | ICD-10-CM | POA: Diagnosis not present

## 2019-05-27 DIAGNOSIS — J449 Chronic obstructive pulmonary disease, unspecified: Secondary | ICD-10-CM | POA: Diagnosis not present

## 2019-05-30 DIAGNOSIS — J479 Bronchiectasis, uncomplicated: Secondary | ICD-10-CM | POA: Diagnosis not present

## 2019-05-30 DIAGNOSIS — R05 Cough: Secondary | ICD-10-CM | POA: Diagnosis not present

## 2019-06-01 DIAGNOSIS — M65321 Trigger finger, right index finger: Secondary | ICD-10-CM | POA: Diagnosis not present

## 2019-06-01 DIAGNOSIS — R202 Paresthesia of skin: Secondary | ICD-10-CM | POA: Diagnosis not present

## 2019-06-01 DIAGNOSIS — M7918 Myalgia, other site: Secondary | ICD-10-CM | POA: Diagnosis not present

## 2019-06-01 DIAGNOSIS — J441 Chronic obstructive pulmonary disease with (acute) exacerbation: Secondary | ICD-10-CM | POA: Diagnosis not present

## 2019-06-03 ENCOUNTER — Other Ambulatory Visit: Payer: Self-pay | Admitting: Allergy and Immunology

## 2019-06-15 ENCOUNTER — Ambulatory Visit: Payer: Medicare HMO | Admitting: Allergy and Immunology

## 2019-06-19 DIAGNOSIS — J449 Chronic obstructive pulmonary disease, unspecified: Secondary | ICD-10-CM | POA: Diagnosis not present

## 2019-06-23 DIAGNOSIS — M7918 Myalgia, other site: Secondary | ICD-10-CM | POA: Diagnosis not present

## 2019-06-23 DIAGNOSIS — F5105 Insomnia due to other mental disorder: Secondary | ICD-10-CM | POA: Diagnosis not present

## 2019-06-23 DIAGNOSIS — R918 Other nonspecific abnormal finding of lung field: Secondary | ICD-10-CM | POA: Diagnosis not present

## 2019-06-23 DIAGNOSIS — R634 Abnormal weight loss: Secondary | ICD-10-CM | POA: Diagnosis not present

## 2019-06-23 DIAGNOSIS — J471 Bronchiectasis with (acute) exacerbation: Secondary | ICD-10-CM | POA: Diagnosis not present

## 2019-06-23 DIAGNOSIS — R05 Cough: Secondary | ICD-10-CM | POA: Diagnosis not present

## 2019-06-23 DIAGNOSIS — Z951 Presence of aortocoronary bypass graft: Secondary | ICD-10-CM | POA: Diagnosis not present

## 2019-06-23 DIAGNOSIS — R5383 Other fatigue: Secondary | ICD-10-CM | POA: Diagnosis not present

## 2019-06-24 DIAGNOSIS — R05 Cough: Secondary | ICD-10-CM | POA: Diagnosis not present

## 2019-06-24 DIAGNOSIS — R0602 Shortness of breath: Secondary | ICD-10-CM | POA: Diagnosis not present

## 2019-06-29 DIAGNOSIS — J471 Bronchiectasis with (acute) exacerbation: Secondary | ICD-10-CM | POA: Diagnosis not present

## 2019-06-29 DIAGNOSIS — F5105 Insomnia due to other mental disorder: Secondary | ICD-10-CM | POA: Diagnosis not present

## 2019-06-29 DIAGNOSIS — F3181 Bipolar II disorder: Secondary | ICD-10-CM | POA: Diagnosis not present

## 2019-06-29 DIAGNOSIS — J168 Pneumonia due to other specified infectious organisms: Secondary | ICD-10-CM | POA: Diagnosis not present

## 2019-06-30 DIAGNOSIS — J449 Chronic obstructive pulmonary disease, unspecified: Secondary | ICD-10-CM | POA: Diagnosis not present

## 2019-06-30 DIAGNOSIS — I281 Aneurysm of pulmonary artery: Secondary | ICD-10-CM | POA: Diagnosis not present

## 2019-06-30 DIAGNOSIS — I700 Atherosclerosis of aorta: Secondary | ICD-10-CM | POA: Diagnosis not present

## 2019-06-30 DIAGNOSIS — R911 Solitary pulmonary nodule: Secondary | ICD-10-CM | POA: Diagnosis not present

## 2019-06-30 DIAGNOSIS — R918 Other nonspecific abnormal finding of lung field: Secondary | ICD-10-CM | POA: Diagnosis not present

## 2019-06-30 DIAGNOSIS — J479 Bronchiectasis, uncomplicated: Secondary | ICD-10-CM | POA: Diagnosis not present

## 2019-07-02 DIAGNOSIS — E782 Mixed hyperlipidemia: Secondary | ICD-10-CM | POA: Diagnosis not present

## 2019-07-02 DIAGNOSIS — F419 Anxiety disorder, unspecified: Secondary | ICD-10-CM | POA: Diagnosis not present

## 2019-07-02 DIAGNOSIS — G8929 Other chronic pain: Secondary | ICD-10-CM | POA: Diagnosis not present

## 2019-07-02 DIAGNOSIS — J471 Bronchiectasis with (acute) exacerbation: Secondary | ICD-10-CM | POA: Diagnosis not present

## 2019-07-02 DIAGNOSIS — K5900 Constipation, unspecified: Secondary | ICD-10-CM | POA: Diagnosis not present

## 2019-07-02 DIAGNOSIS — F319 Bipolar disorder, unspecified: Secondary | ICD-10-CM | POA: Diagnosis not present

## 2019-07-02 DIAGNOSIS — J449 Chronic obstructive pulmonary disease, unspecified: Secondary | ICD-10-CM | POA: Diagnosis not present

## 2019-07-02 DIAGNOSIS — G4730 Sleep apnea, unspecified: Secondary | ICD-10-CM | POA: Diagnosis not present

## 2019-07-02 DIAGNOSIS — M5430 Sciatica, unspecified side: Secondary | ICD-10-CM | POA: Diagnosis not present

## 2019-07-03 DIAGNOSIS — J471 Bronchiectasis with (acute) exacerbation: Secondary | ICD-10-CM | POA: Diagnosis not present

## 2019-07-03 DIAGNOSIS — G8929 Other chronic pain: Secondary | ICD-10-CM | POA: Diagnosis not present

## 2019-07-03 DIAGNOSIS — F319 Bipolar disorder, unspecified: Secondary | ICD-10-CM | POA: Diagnosis not present

## 2019-07-03 DIAGNOSIS — G4730 Sleep apnea, unspecified: Secondary | ICD-10-CM | POA: Diagnosis not present

## 2019-07-03 DIAGNOSIS — K5900 Constipation, unspecified: Secondary | ICD-10-CM | POA: Diagnosis not present

## 2019-07-03 DIAGNOSIS — M5430 Sciatica, unspecified side: Secondary | ICD-10-CM | POA: Diagnosis not present

## 2019-07-03 DIAGNOSIS — E782 Mixed hyperlipidemia: Secondary | ICD-10-CM | POA: Diagnosis not present

## 2019-07-03 DIAGNOSIS — F419 Anxiety disorder, unspecified: Secondary | ICD-10-CM | POA: Diagnosis not present

## 2019-07-03 DIAGNOSIS — J449 Chronic obstructive pulmonary disease, unspecified: Secondary | ICD-10-CM | POA: Diagnosis not present

## 2019-07-04 DIAGNOSIS — J432 Centrilobular emphysema: Secondary | ICD-10-CM | POA: Diagnosis not present

## 2019-07-04 DIAGNOSIS — J449 Chronic obstructive pulmonary disease, unspecified: Secondary | ICD-10-CM | POA: Diagnosis not present

## 2019-07-04 DIAGNOSIS — J470 Bronchiectasis with acute lower respiratory infection: Secondary | ICD-10-CM | POA: Diagnosis not present

## 2019-07-04 DIAGNOSIS — R911 Solitary pulmonary nodule: Secondary | ICD-10-CM | POA: Diagnosis not present

## 2019-07-07 ENCOUNTER — Other Ambulatory Visit: Payer: Self-pay | Admitting: Allergy and Immunology

## 2019-07-07 DIAGNOSIS — G4730 Sleep apnea, unspecified: Secondary | ICD-10-CM | POA: Diagnosis not present

## 2019-07-07 DIAGNOSIS — F419 Anxiety disorder, unspecified: Secondary | ICD-10-CM | POA: Diagnosis not present

## 2019-07-07 DIAGNOSIS — J471 Bronchiectasis with (acute) exacerbation: Secondary | ICD-10-CM | POA: Diagnosis not present

## 2019-07-07 DIAGNOSIS — K5900 Constipation, unspecified: Secondary | ICD-10-CM | POA: Diagnosis not present

## 2019-07-07 DIAGNOSIS — J449 Chronic obstructive pulmonary disease, unspecified: Secondary | ICD-10-CM | POA: Diagnosis not present

## 2019-07-07 DIAGNOSIS — M5430 Sciatica, unspecified side: Secondary | ICD-10-CM | POA: Diagnosis not present

## 2019-07-07 DIAGNOSIS — E782 Mixed hyperlipidemia: Secondary | ICD-10-CM | POA: Diagnosis not present

## 2019-07-07 DIAGNOSIS — F319 Bipolar disorder, unspecified: Secondary | ICD-10-CM | POA: Diagnosis not present

## 2019-07-07 DIAGNOSIS — G8929 Other chronic pain: Secondary | ICD-10-CM | POA: Diagnosis not present

## 2019-07-11 DIAGNOSIS — H269 Unspecified cataract: Secondary | ICD-10-CM | POA: Diagnosis not present

## 2019-07-11 DIAGNOSIS — H524 Presbyopia: Secondary | ICD-10-CM | POA: Diagnosis not present

## 2019-07-14 DIAGNOSIS — F419 Anxiety disorder, unspecified: Secondary | ICD-10-CM | POA: Diagnosis not present

## 2019-07-14 DIAGNOSIS — J449 Chronic obstructive pulmonary disease, unspecified: Secondary | ICD-10-CM | POA: Diagnosis not present

## 2019-07-14 DIAGNOSIS — K5900 Constipation, unspecified: Secondary | ICD-10-CM | POA: Diagnosis not present

## 2019-07-14 DIAGNOSIS — M5430 Sciatica, unspecified side: Secondary | ICD-10-CM | POA: Diagnosis not present

## 2019-07-14 DIAGNOSIS — F319 Bipolar disorder, unspecified: Secondary | ICD-10-CM | POA: Diagnosis not present

## 2019-07-14 DIAGNOSIS — E782 Mixed hyperlipidemia: Secondary | ICD-10-CM | POA: Diagnosis not present

## 2019-07-14 DIAGNOSIS — D649 Anemia, unspecified: Secondary | ICD-10-CM | POA: Diagnosis not present

## 2019-07-14 DIAGNOSIS — D510 Vitamin B12 deficiency anemia due to intrinsic factor deficiency: Secondary | ICD-10-CM | POA: Diagnosis not present

## 2019-07-14 DIAGNOSIS — G8929 Other chronic pain: Secondary | ICD-10-CM | POA: Diagnosis not present

## 2019-07-14 DIAGNOSIS — J471 Bronchiectasis with (acute) exacerbation: Secondary | ICD-10-CM | POA: Diagnosis not present

## 2019-07-14 DIAGNOSIS — G4730 Sleep apnea, unspecified: Secondary | ICD-10-CM | POA: Diagnosis not present

## 2019-07-15 DIAGNOSIS — D6489 Other specified anemias: Secondary | ICD-10-CM | POA: Diagnosis not present

## 2019-07-15 DIAGNOSIS — I951 Orthostatic hypotension: Secondary | ICD-10-CM | POA: Diagnosis not present

## 2019-07-19 DIAGNOSIS — J449 Chronic obstructive pulmonary disease, unspecified: Secondary | ICD-10-CM | POA: Diagnosis not present

## 2019-07-20 DIAGNOSIS — G8929 Other chronic pain: Secondary | ICD-10-CM | POA: Diagnosis not present

## 2019-07-20 DIAGNOSIS — M8949 Other hypertrophic osteoarthropathy, multiple sites: Secondary | ICD-10-CM | POA: Diagnosis not present

## 2019-07-20 DIAGNOSIS — R109 Unspecified abdominal pain: Secondary | ICD-10-CM | POA: Diagnosis not present

## 2019-07-20 DIAGNOSIS — M47817 Spondylosis without myelopathy or radiculopathy, lumbosacral region: Secondary | ICD-10-CM | POA: Diagnosis not present

## 2019-07-20 DIAGNOSIS — G894 Chronic pain syndrome: Secondary | ICD-10-CM | POA: Diagnosis not present

## 2019-07-21 DIAGNOSIS — M9903 Segmental and somatic dysfunction of lumbar region: Secondary | ICD-10-CM | POA: Diagnosis not present

## 2019-07-21 DIAGNOSIS — M9901 Segmental and somatic dysfunction of cervical region: Secondary | ICD-10-CM | POA: Diagnosis not present

## 2019-07-21 DIAGNOSIS — M9902 Segmental and somatic dysfunction of thoracic region: Secondary | ICD-10-CM | POA: Diagnosis not present

## 2019-07-21 DIAGNOSIS — M4303 Spondylolysis, cervicothoracic region: Secondary | ICD-10-CM | POA: Diagnosis not present

## 2019-07-21 DIAGNOSIS — M4304 Spondylolysis, thoracic region: Secondary | ICD-10-CM | POA: Diagnosis not present

## 2019-07-21 DIAGNOSIS — M4307 Spondylolysis, lumbosacral region: Secondary | ICD-10-CM | POA: Diagnosis not present

## 2019-07-22 DIAGNOSIS — G8929 Other chronic pain: Secondary | ICD-10-CM | POA: Diagnosis not present

## 2019-07-22 DIAGNOSIS — E782 Mixed hyperlipidemia: Secondary | ICD-10-CM | POA: Diagnosis not present

## 2019-07-22 DIAGNOSIS — J449 Chronic obstructive pulmonary disease, unspecified: Secondary | ICD-10-CM | POA: Diagnosis not present

## 2019-07-22 DIAGNOSIS — J471 Bronchiectasis with (acute) exacerbation: Secondary | ICD-10-CM | POA: Diagnosis not present

## 2019-07-22 DIAGNOSIS — G4730 Sleep apnea, unspecified: Secondary | ICD-10-CM | POA: Diagnosis not present

## 2019-07-22 DIAGNOSIS — K5900 Constipation, unspecified: Secondary | ICD-10-CM | POA: Diagnosis not present

## 2019-07-22 DIAGNOSIS — F319 Bipolar disorder, unspecified: Secondary | ICD-10-CM | POA: Diagnosis not present

## 2019-07-22 DIAGNOSIS — M5430 Sciatica, unspecified side: Secondary | ICD-10-CM | POA: Diagnosis not present

## 2019-07-22 DIAGNOSIS — F419 Anxiety disorder, unspecified: Secondary | ICD-10-CM | POA: Diagnosis not present

## 2019-07-26 ENCOUNTER — Telehealth: Payer: Self-pay | Admitting: Allergy and Immunology

## 2019-07-26 NOTE — Telephone Encounter (Signed)
RX sent for the nebulizer. The portable equipment he was referring to was oxygen. I spoke with [facility] and they are faxing an order for Dr. Neldon Mc to sign.

## 2019-07-26 NOTE — Telephone Encounter (Signed)
Please give Skylan what ever he would like regarding a nebulizer machine.  Please coordinate with [facility].

## 2019-07-26 NOTE — Telephone Encounter (Signed)
Isaac Walsh called in and states he is having issues with his nebulizer and it is taking too long to administer medications.  [facility] told Isaac Walsh they can order him a new one as long as he gets an order from Dr. Neldon Mc.  Isaac Walsh would also like to know if he can get a portable for the beach.  He is leaving September 6th and coming back on the 12th.  Please advise.

## 2019-07-28 DIAGNOSIS — J471 Bronchiectasis with (acute) exacerbation: Secondary | ICD-10-CM | POA: Diagnosis not present

## 2019-07-28 DIAGNOSIS — K5900 Constipation, unspecified: Secondary | ICD-10-CM | POA: Diagnosis not present

## 2019-07-28 DIAGNOSIS — M5430 Sciatica, unspecified side: Secondary | ICD-10-CM | POA: Diagnosis not present

## 2019-07-28 DIAGNOSIS — E782 Mixed hyperlipidemia: Secondary | ICD-10-CM | POA: Diagnosis not present

## 2019-07-28 DIAGNOSIS — F319 Bipolar disorder, unspecified: Secondary | ICD-10-CM | POA: Diagnosis not present

## 2019-07-28 DIAGNOSIS — F419 Anxiety disorder, unspecified: Secondary | ICD-10-CM | POA: Diagnosis not present

## 2019-07-28 DIAGNOSIS — G4730 Sleep apnea, unspecified: Secondary | ICD-10-CM | POA: Diagnosis not present

## 2019-07-28 DIAGNOSIS — J449 Chronic obstructive pulmonary disease, unspecified: Secondary | ICD-10-CM | POA: Diagnosis not present

## 2019-07-28 DIAGNOSIS — G8929 Other chronic pain: Secondary | ICD-10-CM | POA: Diagnosis not present

## 2019-08-01 DIAGNOSIS — I2510 Atherosclerotic heart disease of native coronary artery without angina pectoris: Secondary | ICD-10-CM | POA: Diagnosis not present

## 2019-08-01 DIAGNOSIS — F5101 Primary insomnia: Secondary | ICD-10-CM | POA: Diagnosis not present

## 2019-08-01 DIAGNOSIS — F3181 Bipolar II disorder: Secondary | ICD-10-CM | POA: Diagnosis not present

## 2019-08-01 DIAGNOSIS — J471 Bronchiectasis with (acute) exacerbation: Secondary | ICD-10-CM | POA: Diagnosis not present

## 2019-08-01 DIAGNOSIS — G894 Chronic pain syndrome: Secondary | ICD-10-CM | POA: Diagnosis not present

## 2019-08-01 DIAGNOSIS — R05 Cough: Secondary | ICD-10-CM | POA: Diagnosis not present

## 2019-08-05 DIAGNOSIS — F319 Bipolar disorder, unspecified: Secondary | ICD-10-CM | POA: Diagnosis not present

## 2019-08-05 DIAGNOSIS — E782 Mixed hyperlipidemia: Secondary | ICD-10-CM | POA: Diagnosis not present

## 2019-08-05 DIAGNOSIS — J449 Chronic obstructive pulmonary disease, unspecified: Secondary | ICD-10-CM | POA: Diagnosis not present

## 2019-08-05 DIAGNOSIS — J471 Bronchiectasis with (acute) exacerbation: Secondary | ICD-10-CM | POA: Diagnosis not present

## 2019-08-05 DIAGNOSIS — F419 Anxiety disorder, unspecified: Secondary | ICD-10-CM | POA: Diagnosis not present

## 2019-08-05 DIAGNOSIS — K5900 Constipation, unspecified: Secondary | ICD-10-CM | POA: Diagnosis not present

## 2019-08-05 DIAGNOSIS — G4730 Sleep apnea, unspecified: Secondary | ICD-10-CM | POA: Diagnosis not present

## 2019-08-05 DIAGNOSIS — G8929 Other chronic pain: Secondary | ICD-10-CM | POA: Diagnosis not present

## 2019-08-05 DIAGNOSIS — M5430 Sciatica, unspecified side: Secondary | ICD-10-CM | POA: Diagnosis not present

## 2019-08-10 ENCOUNTER — Ambulatory Visit: Payer: Medicare HMO | Admitting: Allergy and Immunology

## 2019-08-11 ENCOUNTER — Other Ambulatory Visit: Payer: Self-pay

## 2019-08-11 ENCOUNTER — Ambulatory Visit (INDEPENDENT_AMBULATORY_CARE_PROVIDER_SITE_OTHER): Payer: Medicare HMO | Admitting: Allergy and Immunology

## 2019-08-11 ENCOUNTER — Encounter: Payer: Self-pay | Admitting: Allergy and Immunology

## 2019-08-11 ENCOUNTER — Other Ambulatory Visit: Payer: Self-pay | Admitting: Allergy and Immunology

## 2019-08-11 ENCOUNTER — Telehealth: Payer: Self-pay

## 2019-08-11 VITALS — BP 102/52 | HR 76 | Resp 22

## 2019-08-11 DIAGNOSIS — B965 Pseudomonas (aeruginosa) (mallei) (pseudomallei) as the cause of diseases classified elsewhere: Secondary | ICD-10-CM

## 2019-08-11 DIAGNOSIS — D72810 Lymphocytopenia: Secondary | ICD-10-CM

## 2019-08-11 DIAGNOSIS — K219 Gastro-esophageal reflux disease without esophagitis: Secondary | ICD-10-CM

## 2019-08-11 DIAGNOSIS — G4734 Idiopathic sleep related nonobstructive alveolar hypoventilation: Secondary | ICD-10-CM | POA: Diagnosis not present

## 2019-08-11 DIAGNOSIS — A498 Other bacterial infections of unspecified site: Secondary | ICD-10-CM | POA: Diagnosis not present

## 2019-08-11 DIAGNOSIS — J988 Other specified respiratory disorders: Secondary | ICD-10-CM

## 2019-08-11 DIAGNOSIS — J449 Chronic obstructive pulmonary disease, unspecified: Secondary | ICD-10-CM | POA: Diagnosis not present

## 2019-08-11 DIAGNOSIS — J479 Bronchiectasis, uncomplicated: Secondary | ICD-10-CM | POA: Diagnosis not present

## 2019-08-11 DIAGNOSIS — D839 Common variable immunodeficiency, unspecified: Secondary | ICD-10-CM | POA: Diagnosis not present

## 2019-08-11 MED ORDER — EPINEPHRINE 0.3 MG/0.3ML IJ SOAJ: INTRAMUSCULAR | 1 refills | Status: AC

## 2019-08-11 MED ORDER — ALBUTEROL SULFATE HFA 108 (90 BASE) MCG/ACT IN AERS: INHALATION_SPRAY | RESPIRATORY_TRACT | 1 refills | Status: DC

## 2019-08-11 MED ORDER — IPRATROPIUM-ALBUTEROL 0.5-2.5 (3) MG/3ML IN SOLN: RESPIRATORY_TRACT | 1 refills | Status: AC

## 2019-08-11 NOTE — Patient Instructions (Addendum)
  1. Continue Privagen infusions at 100G every 3 weeks.   2.  Continue Symbicort 160 -2 inhalations 2 times per day  3. Continue Montelukast 51m one tablet one time per day   4. Continue Omeprazole 40 twice a day  5. Stay on prednisone 10 mg daily until next visit  6. Use bronchodilator (Duoneb) or Proair HFA 2 puffs every 4-6 hours if needed.  7.  Use 2 L nasal oxygen 24 hours a day  8.  [facility] (bronchiectasis/ILD/chronic infection)  9. Return in 3 weeks or earlier if problem.  10.  Obtain serum IgG level and CBC w/diff prior to next Privigen infusion

## 2019-08-11 NOTE — Progress Notes (Signed)
[place] [facility] [place] [facility] [place]   Follow-up Note  Referring Provider: Rochel Brome, MD Primary Provider: Rochel Brome, MD Date of Office Visit: 08/11/2019  Subjective:   Isaac Walsh (DOB: [birth year]) is a 53 y.o. male who returns to the [facility] [place] on 08/11/2019 in re-evaluation of the following:  HPI: Isaac Walsh returns to this clinic in evaluation of hypogammaglobulinemia, lymphopenia, chronic inflammatory lung disease with a history of Pseudomonas respiratory tract colonization and bronchiectasis and possible granulomatous inflammation/ILD, untreated sleep apnea with nocturnal hypoxemia, and a history of recent coronary artery disease requiring bypass surgery complicated by a persistent swallowing problem ever since that surgery.  I last saw him in his clinic on 18 May 2019.  Isaac Walsh's biggest complaint is the fact that he has unrelenting cough associated with yellow thick mucoid sputum production which has been an active process for 2020.  Apparently he has been treated with several courses of Levaquin since I have seen him in this clinic and is presently using a course of prednisone.  He does appear to respond to the administration of prednisone regarding his sputum production and his cough and does not appear to respond to the administration of Levaquin.  He is very frustrated as the pulmonologist in [facility] appears to be "blowing him off".  His next appointment is in January 2021.  Apparently he has been evaluated for hypersensitivity pneumonitis and various other antibody based studies.  It should be noted that Isaac Walsh does not make any antibodies and the only antibodies in his body are those provided by his Privigen infusion.  Apparently he has been evaluated for sarcoidosis and his ACE level is normal.  When he was last seen in this clinic he related a story of having significant swallowing difficulties ever since he had  bypass surgery earlier this year.  That fortunately appears to have improved significantly since his last visit while using omeprazole twice a day.  He continues to not treat his documented sleep apnea but does remain on oxygen 24 hours a day at this point.  He continues on a collection of anti-inflammatory agents for his airway including Symbicort and montelukast.  He continues on Privigen infusions without adverse effect.  Allergies as of 08/11/2019      Reactions   Cephalosporins Hives   Other reaction(s): Hives   Corticosteroids Hives   Quinolones Hives   Other reaction(s): Hives Other reaction(s): Hives   Betamethasone Rash, Hives   Other reaction(s): Hives, Rash   Cefaclor Dermatitis, Rash, Hives   Other reaction(s): Hives, Rash   Ceftriaxone Hives, Rash, Dermatitis   Other reaction(s): Hives, Rash   Ketorolac Other (See Comments), Rash, Hives   Other reaction(s): Hives, Rash Manic symptoms   Loratadine Dermatitis, Rash, Hives   Other reaction(s): Hives, Rash   Naproxen Rash, Hives   Other reaction(s): Hives, Rash Looks like shingles   Androgel Pump [testosterone]    rash   Avelox [moxifloxacin]    Blisters and "doesn't work for me" per pt   Effexor [venlafaxine]    Flumist [flu Virus Vaccine]    Pt reports he can tolerate flu shot but not mist   Other    Tizanidine    Toradol [ketorolac Tromethamine]    Vicodin [hydrocodone-acetaminophen] Nausea Only      Medication List      acyclovir 400 MG tablet Commonly known as: ZOVIRAX Take 400 mg by mouth 5 (five) times daily. As needed   albuterol  108 (90 Base) MCG/ACT inhaler Commonly known as: ProAir HFA Inhale two puffs every four to six hours as needed for cough or wheeze.   ARIPiprazole 30 MG tablet Commonly known as: ABILIFY Take 30 mg by mouth daily.   budesonide-formoterol 160-4.5 MCG/ACT inhaler Commonly known as: SYMBICORT INHALE 2 PUFFS BY MOUTH TWICE DAILY TO PREVENT COUGH OR WHEEZE. RINSE MOUTH AFTER  USE   clopidogrel 75 MG tablet Commonly known as: PLAVIX Take 75 mg by mouth daily.   clotrimazole-betamethasone cream Commonly known as: LOTRISONE APPLY A SMALL AMOUNT OF CREAM TO AFFECTED AREA(S) TWICE DAILY   EPINEPHrine 0.3 mg/0.3 mL Soaj injection Commonly known as: EPI-PEN Use as directed for life-threatening allergic reaction.   fenofibrate micronized 134 MG capsule Commonly known as: LOFIBRA Take 134 mg by mouth daily.   finasteride 1 MG tablet Commonly known as: PROPECIA Take 1 mg by mouth daily.   Hematinic Plus Complex 106-1 MG Tabs Take 1 capsule by mouth 2 (two) times daily.   Immune Globulin 10% 10G/14m (10,0050m100mL) Soln Generic drug: Immune Globulin 10%   Privigen 40 GM/400ML Soln Generic drug: Immune Globulin (Human) INFUSE INTRAVENOUS 100 GRAMS OVER 4-8 HOURS AS TOLERATED EVERY 3 WEEKS   ipratropium-albuterol 0.5-2.5 (3) MG/3ML Soln Commonly known as: DUONEB USE 1 VIAL IN NEBULIZER EVERY 4 TO 6 HOURS AS NEEDED FOR COUGH OR WHEEZING   levothyroxine 100 MCG tablet Commonly known as: SYNTHROID Take 100 mcg by mouth daily before breakfast.   meloxicam 7.5 MG tablet Commonly known as: MOBIC TAKE 1 TABLET BY MOUTH ONCE TO TWICE DAILY AS NEEDED FOR PAIN TENDONITIS   methocarbamol 500 MG tablet Commonly known as: ROBAXIN TAKE 2 TABLETS BY MOUTH 4 TIMES DAILY AS NEEDED FOR MUSCLE SPASM   metoprolol tartrate 25 MG tablet Commonly known as: LOPRESSOR Take 25 mg by mouth 2 (two) times daily.   montelukast 10 MG tablet Commonly known as: SINGULAIR Take 10 mg by mouth at bedtime.   Multi-Vitamin tablet Take by mouth.   nitroGLYCERIN 0.4 MG SL tablet Commonly known as: NITROSTAT Place 0.4 mg under the tongue as needed.   omeprazole 40 MG capsule Commonly known as: PRILOSEC Take 1 capsule by mouth twice daily   oxyCODONE-acetaminophen 10-325 MG tablet Commonly known as: PERCOCET Take 1 tablet by mouth 4 (four) times daily.   predniSONE 10  MG tablet Commonly known as: DELTASONE Take 10 mg by mouth 2 (two) times daily.   rosuvastatin 5 MG tablet Commonly known as: CRESTOR Take 5 mg by mouth daily.   sertraline 100 MG tablet Commonly known as: ZOLOFT Take 100 mg by mouth daily.   SYSTANE OP Apply to eye every 4 (four) hours.   traZODone 100 MG tablet Commonly known as: DESYREL Take 200 mg by mouth at bedtime.   Vascepa 1 g Caps Generic drug: Icosapent Ethyl TAKE 2 CAPSULES BY MOUTH TWICE DAILY WITH FOOD SWALLOW WHOLE. DO NOT CHEW OPEN DISSOLVE AND OR CRUSH   zolpidem 12.5 MG CR tablet Commonly known as: AMBIEN CR Take 12.5 mg by mouth at bedtime.       Past Medical History:  Diagnosis Date   Abnormal heart rhythm    Asthma    Chronic pain syndrome    Common variable immunodeficiency (HCC)    Congenital dysgammaglobulinemia (HCC)    COPD (chronic obstructive pulmonary disease) (HCC)    Depression with anxiety    Fatigue    GERD (gastroesophageal reflux disease)    Hypercholesterolemia    Hypothyroidism  Lymphoma ([place])    as a child   Nonalcoholic fatty liver disease    OSA (obstructive sleep apnea)    Osgood-Schlatter's disease    Other nonspecific abnormal finding of lung field    Plantar fasciitis    Renal stones    Sciatica    SCID (severe combined immunodeficiency disease) ([place])    Trigger finger    Unspecified deficiency anemia    Xerostomia     Past Surgical History:  Procedure Laterality Date   CORONARY ARTERY BYPASS GRAFT     LIVER SURGERY     10% of liver removed   LYMPHADENECTOMY     sinusectomy     TYMPANOSTOMY TUBE PLACEMENT      Review of systems negative except as noted in HPI / PMHx or noted below:  Review of Systems  Constitutional: Negative.   HENT: Negative.   Eyes: Negative.   Respiratory: Negative.   Cardiovascular: Negative.   Gastrointestinal: Negative.   Genitourinary: Negative.   Musculoskeletal: Negative.   Skin: Negative.    Neurological: Negative.   Endo/Heme/Allergies: Negative.   Psychiatric/Behavioral: Negative.      Objective:   Vitals:   08/11/19 1130  BP: (!) 102/52  Pulse: 76  Resp: (!) 22  SpO2: 98%          Physical Exam Constitutional:      Appearance: He is not diaphoretic.  HENT:     Head: Normocephalic.     Right Ear: Tympanic membrane, ear canal and external ear normal.     Left Ear: Tympanic membrane, ear canal and external ear normal.     Nose: Nose normal. No mucosal edema (Septal perforation) or rhinorrhea.     Mouth/Throat:     Pharynx: Uvula midline. No oropharyngeal exudate.  Eyes:     Conjunctiva/sclera: Conjunctivae normal.  Neck:     Thyroid: No thyromegaly.     Trachea: Trachea normal. No tracheal tenderness or tracheal deviation.  Cardiovascular:     Rate and Rhythm: Normal rate and regular rhythm.     Heart sounds: S1 normal and S2 normal. Murmur (Systolic/diastolic murmur) present.  Pulmonary:     Effort: No respiratory distress.     Breath sounds: No stridor. Wheezing (Scattered inspiratory and expiratory wheezing throughout all lung fields) present. No rales.  Lymphadenopathy:     Head:     Right side of head: No tonsillar adenopathy.     Left side of head: No tonsillar adenopathy.     Cervical: No cervical adenopathy.  Skin:    Findings: No erythema or rash.     Nails: There is no clubbing.   Neurological:     Mental Status: He is alert.     Diagnostics:    Spirometry was performed and demonstrated an FEV1 of 1.02 at 32 % of predicted.  Results of a chest CT scan obtained 24 May 2019 identified the following:  1. Progressive and diffuse para-bronchovascular micro nodularity, groundglass and traction bronchiectasis, together with borderline enlarged mediastinal lymph nodes, findings can be seen with sarcoid. Chronic hypersensitivity pneumonitis is not excluded. 2. New bilateral pulmonary nodules measuring up to 9 mm.   3. Aortic  atherosclerosis 4. Enlarged pulmonary trunk, indicative of pulmonary arterial hypertension  Results of a chest CT scan obtained 01 July 2019 identified the following:  Cardiovascular: Normal heart size. No significant pericardial effusion/thickening. Left anterior descending and left circumflex coronary atherosclerosis status post CABG. Atherosclerotic nonaneurysmal thoracic aorta. Stable dilated main pulmonary artery (4.1  cm diameter).  Mediastinum/Nodes: No discrete thyroid nodules. Unremarkable esophagus. No axillary adenopathy. Stable enlarged 1.5 cm subcarinal node (series 2/image 58). No new pathologically enlarged mediastinal nodes. No discrete hilar adenopathy on this noncontrast scan.  Lungs/Pleura: No pneumothorax. No pleural effusion. No acute consolidative airspace disease or lung masses. Mosaic attenuation throughout both lungs is unchanged. There is extensive patchy finely nodular thickening of the peribronchovascular interstitium throughout both lungs with associated mild traction bronchiectasis and mild architectural distortion. There is similar fine nodularity along bilateral pulmonary fissures and along the subpleural lungs. These findings are slightly more advanced in the upper than lower lungs and have not appreciably changed in the interval. Previously described scattered solid nodules in the right lung are stable to mildly decreased. For example 1.0 cm right lower lobe nodule (series 3/image 82), previously 1.0 cm, stable. Separate right lower lobe 0.8 cm nodule (series 3/image 84), previously 0.8 cm, stable. Posterior apical right upper lobe 0.9 cm nodule, ill-defined (series 3/image 37), previously 1.1 cm using similar measurement technique, mildly decreased. No new significant pulmonary nodules.  Assessment and Plan:   1. CVID (common variable immunodeficiency) (HCC)   2. Lymphopenia   3. COPD with asthma ([place])   4. Pseudomonas respiratory infection   5.  Bronchiectasis without complication ([place])   6. Nocturnal hypoxemia   7. Gastroesophageal reflux disease, esophagitis presence not specified     1. Continue Privagen infusions at 100G every 3 weeks.   2.  Continue Symbicort 160 -2 inhalations 2 times per day  3. Continue Montelukast 23m one tablet one time per day   4. Continue Omeprazole 40 twice a day  5. Stay on prednisone 10 mg daily until next visit  6. Use bronchodilator (Duoneb) or Proair HFA 2 puffs every 4-6 hours if needed.  7.  Use 2 L nasal oxygen 24 hours a day  8.  [facility] (bronchiectasis/ILD/chronic infection)  9. Return in 3 weeks or earlier if problem.  10.  Obtain serum IgG level and CBC w/diff prior to next Privigen infusion  I am very worried that CArronis going to get into a situation where he has very significant Pseudomonas colonization and infection of his airway given the fact that he has a immunodeficiency without production of antibodies and lymphopenia, receives intermittent antibiotic therapy and receives intermittent systemic steroid therapy.  I think we need to investigate what is going on in his chest and I strongly recommend that he get a bronchoscopy to discern whether or not he has chronic infection or an granulomatous inflammatory lung disease or ILD or possible Mycobacterium infection associated with his CVID.  If it looks as though he has mostly inflammatory lung disease then he may need to remain on systemic steroids at low-dose for a prolonged period in time.  If his problem appears to be lots of Pseudomonas growth within his airway then that obviously will need to be addressed.  [facility].  EAllena Katz MD Allergy / Immunology [place]

## 2019-08-11 NOTE — Telephone Encounter (Signed)
Faxed ASAP referral to [facility] requesting ASAP appointment for possible bronchoscopy.  They will contact the patient with appointment information.

## 2019-08-12 ENCOUNTER — Encounter: Payer: Self-pay | Admitting: Internal Medicine

## 2019-08-17 ENCOUNTER — Telehealth: Payer: Self-pay | Admitting: *Deleted

## 2019-08-17 DIAGNOSIS — D839 Common variable immunodeficiency, unspecified: Secondary | ICD-10-CM

## 2019-08-17 DIAGNOSIS — B999 Unspecified infectious disease: Secondary | ICD-10-CM

## 2019-08-17 NOTE — Telephone Encounter (Signed)
Called [facility] and spoke to pharmacist, Sharyn Lull to contact nursing to draw CBC w/diff and serum IgG prior to next infusion

## 2019-08-17 NOTE — Telephone Encounter (Signed)
-----  Message from Vickii Penna, [place] sent at 08/11/2019 12:14 PM EDT ----- Hi Cacey Willow,  Dr.Kozlow would like Isaac Walsh to get a CBC with Diff and serum IgG blood test before his next infusion.  Is this something that the infusion company can draw or should we send him to [facility]?  I did not remember what we did in the past as far as getting his blood work. Thank you Lesslie Mossa:-)  Jarrett Soho

## 2019-08-18 DIAGNOSIS — J449 Chronic obstructive pulmonary disease, unspecified: Secondary | ICD-10-CM | POA: Diagnosis not present

## 2019-08-18 DIAGNOSIS — G8929 Other chronic pain: Secondary | ICD-10-CM | POA: Diagnosis not present

## 2019-08-18 DIAGNOSIS — E782 Mixed hyperlipidemia: Secondary | ICD-10-CM | POA: Diagnosis not present

## 2019-08-18 DIAGNOSIS — G4730 Sleep apnea, unspecified: Secondary | ICD-10-CM | POA: Diagnosis not present

## 2019-08-18 DIAGNOSIS — M5430 Sciatica, unspecified side: Secondary | ICD-10-CM | POA: Diagnosis not present

## 2019-08-18 DIAGNOSIS — J471 Bronchiectasis with (acute) exacerbation: Secondary | ICD-10-CM | POA: Diagnosis not present

## 2019-08-18 DIAGNOSIS — F319 Bipolar disorder, unspecified: Secondary | ICD-10-CM | POA: Diagnosis not present

## 2019-08-18 DIAGNOSIS — K5900 Constipation, unspecified: Secondary | ICD-10-CM | POA: Diagnosis not present

## 2019-08-18 DIAGNOSIS — F419 Anxiety disorder, unspecified: Secondary | ICD-10-CM | POA: Diagnosis not present

## 2019-08-18 NOTE — Addendum Note (Signed)
Addended by: Carin Hock on: 08/18/2019 04:32 PM   Modules accepted: Orders

## 2019-08-18 NOTE — Telephone Encounter (Signed)
Spoke to [facility] nurse that will be doing infusion.  She advised that infusions takes 5 hours will need order to drop off in town. Will fax order to her at [phone] Despina Hidden so she can do same

## 2019-08-19 DIAGNOSIS — J449 Chronic obstructive pulmonary disease, unspecified: Secondary | ICD-10-CM | POA: Diagnosis not present

## 2019-08-22 ENCOUNTER — Ambulatory Visit (INDEPENDENT_AMBULATORY_CARE_PROVIDER_SITE_OTHER): Payer: Medicare HMO | Admitting: Internal Medicine

## 2019-08-22 ENCOUNTER — Encounter: Payer: Self-pay | Admitting: Internal Medicine

## 2019-08-22 ENCOUNTER — Other Ambulatory Visit: Payer: Self-pay

## 2019-08-22 ENCOUNTER — Ambulatory Visit (INDEPENDENT_AMBULATORY_CARE_PROVIDER_SITE_OTHER): Payer: Medicare HMO

## 2019-08-22 DIAGNOSIS — J45991 Cough variant asthma: Secondary | ICD-10-CM | POA: Diagnosis not present

## 2019-08-22 DIAGNOSIS — J449 Chronic obstructive pulmonary disease, unspecified: Secondary | ICD-10-CM

## 2019-08-22 DIAGNOSIS — R918 Other nonspecific abnormal finding of lung field: Secondary | ICD-10-CM | POA: Diagnosis not present

## 2019-08-22 DIAGNOSIS — R05 Cough: Secondary | ICD-10-CM | POA: Diagnosis not present

## 2019-08-22 MED ORDER — BUDESONIDE-FORMOTEROL FUMARATE 80-4.5 MCG/ACT IN AERO: 2.0000 | INHALATION_SPRAY | Freq: Two times a day (BID) | RESPIRATORY_TRACT | 0 refills | Status: DC

## 2019-08-22 MED ORDER — BUDESONIDE-FORMOTEROL FUMARATE 80-4.5 MCG/ACT IN AERO: 2.0000 | INHALATION_SPRAY | Freq: Two times a day (BID) | RESPIRATORY_TRACT | 11 refills | Status: AC

## 2019-08-22 MED ORDER — BUDESONIDE-FORMOTEROL FUMARATE 80-4.5 MCG/ACT IN AERO: 2.0000 | INHALATION_SPRAY | Freq: Two times a day (BID) | RESPIRATORY_TRACT | 11 refills | Status: DC

## 2019-08-22 NOTE — Patient Instructions (Addendum)
Prilosec 40 mg Take 30-60 min before first meal of the day  Add: should have said  Take 30- 60 min before your first and last meals of the day   GERD (REFLUX)  is an extremely common cause of respiratory symptoms just like yours , many times with no obvious heartburn at all.    It can be treated with medication, but also with lifestyle changes including elevation of the head of your bed (ideally with 6 -8inch blocks under the headboard of your bed),  Smoking cessation, avoidance of late meals, excessive alcohol, and avoid fatty foods, chocolate, peppermint, colas, red wine, and acidic juices such as orange juice.  NO MINT OR MENTHOL PRODUCTS SO NO COUGH DROPS  USE SUGARLESS CANDY INSTEAD (Jolley ranchers or Stover's or Life Savers) or even ice chips will also do - the key is to swallow to prevent all throat clearing. NO OIL BASED VITAMINS - use powdered substitutes.  Avoid fish oil when coughing.   Reduce Symbicort 80 Take 2 puffs first thing in am and then another 2 puffs about 12 hours later.   Work on inhaler technique:  relax and gently blow all the way out then take a nice smooth deep breath back in, triggering the inhaler at same time you start breathing in.  Hold for up to 5 seconds if you can. Blow out thru nose. Rinse and gargle with water when done.   Please remember to go to the  x-ray department  for your tests - we will call you with the results when they are available     I will call you once I have a chance to review of your records  Add: next step MBS

## 2019-08-22 NOTE — Assessment & Plan Note (Deleted)
08/22/2019  After extensive coaching inhaler device,  effectiveness =   50%  -

## 2019-08-22 NOTE — Progress Notes (Signed)
Isaac Walsh, male    DOB: 09-30-1966     MRN: [id number]   Brief patient profile:  30 yowm MM pretty bad cough > eval in [place]  quit  Smoking 1996  ? Some better then worse then eventually dx'd as immundeficiency 2010 by Isaac Walsh on Rx IgG q 3 weeks and helped then much worse p cabg March 2020 with pulmonary eval c/w MPN's   Prior pulm eval by Isaac Walsh  03/11/2019  Assessment/ Plan:   1. Stage 2 moderate COPD by GOLD classification ([place])  2. Centrilobular emphysema ([place])  3. Obstructive sleep apnea  4. Bronchiectasis without complication (HCC)  5. Lung nodule  6. Abnormal pulmonary function  7. Precordial chest pain   #1 moderate Gold stage II COPD 2. Emphysema Unable to do spirometry today due to recent open heart surgery with healing chest scar Pulmonary function test 02/22/2019 notes ratio of 68%, FEV1 51%, FVC 58%  chest CT 02/18/2019 notes emphysematous changes Continue Symbicort 160/4.5 twice daily as directed Continue DuoNeb nebulizer treatments as needed for severe chest congestion chest tightness shortness of breath  3. Obstructive sleep apnea Mr. Isaac Walsh reports having sleep study conducted 10 years ago but never starting the CPAP machine. He reports he is unable to wear the mask due to claustrophobia.  4. Bronchiectasis Bronchiectasis noted on chest CT 02/18/2019. He reports cough producing a yellowish-green mucus. Labs for atypical infection including Aspergillus, fungal antibody panel, pneumonitis Azithromycin 250 mg take as directed x5 days I advised that he monitor himself for early signs of upper respiratory infection and seek aggressive treatment to prevent symptoms from escalating to bronchitis or pneumonia. He reports having multiple episodes of pneumonia throughout his lifetime  5. Lung nodule Multiple nodules noted on chest CT 02/18/2019 with advice to recheck again in 6 months Chest CT 08/19/2019 for reevaluation of lung nodules  6. Abnormal  pulmonary function Precordial chest pain Spirometry and pulmonary function test postponed for 3 months to allow for healing of the open heart surgery Pulmonary function test to be scheduled for June 2020       History of Present Illness  08/22/2019  Pulmonary/ 1st office eval/Isaac Walsh  Chief Complaint  Patient presents with  . Pulmonary Consult    Referred by Isaac Walsh. Pt c/o cough since Jan 2020, worse since March 2020 when he had triple bypass surgery. His cough is prod with pale yellow sputum. Cough is worse when lies down.  Dyspnea:   50-100 ft slowly while on 2lpm continuous -still does [employer] shopping  Cough: very thick no better on mucinex  Worse when lies down/less purulent s/p levaquin rx x 3 courses this year Sleep: 60 degrees since cabg and on 3lpm continous  SABA use: prior cabg on symbicort and sometime qvar and since cabg sym and neb saba.  No obvious day to day or daytime variability or assoc   mucus plugs or hemoptysis or cp or chest tightness, subjective wheeze or overt sinus or hb symptoms.    Also denies any obvious fluctuation of symptoms with weather or environmental changes or other aggravating or alleviating factors except as outlined above   No unusual exposure hx or h/o childhood pna/ asthma or knowledge of premature birth.  Current Allergies, Complete Past Medical History, Past Surgical History, Family History, and Social History were reviewed in [employer] record.  ROS  The following are not active complaints unless bolded Hoarseness, sore throat, dysphagia improved but not resolved on ppi ,  dental problems, itching, sneezing,  nasal congestion or discharge of excess mucus or purulent secretions, ear ache,   fever, chills, sweats, unintended wt loss or wt gain, classically pleuritic or exertional cp,  orthopnea pnd or arm/hand swelling  or leg swelling, presyncope, palpitations, abdominal pain, anorexia, nausea, vomiting, diarrhea   or change in bowel habits or change in bladder habits, change in stools or change in urine, dysuria, hematuria,  rash, arthralgias, visual complaints, headache, numbness, weakness or ataxia or problems with walking or coordination,  change in mood or  memory.              Past Medical History:  Diagnosis Date  . Abnormal heart rhythm   . Asthma   . Chronic pain syndrome   . Common variable immunodeficiency ([place])   . Congenital dysgammaglobulinemia ([place])   . COPD (chronic obstructive pulmonary disease) ([place])   . Depression with anxiety   . Fatigue   . GERD (gastroesophageal reflux disease)   . Hypercholesterolemia   . Hypothyroidism   . Lymphoma ([place])    as a child  . Nonalcoholic fatty liver disease   . OSA (obstructive sleep apnea)   . Osgood-Schlatter's disease   . Other nonspecific abnormal finding of lung field   . Plantar fasciitis   . Renal stones   . Sciatica   . SCID (severe combined immunodeficiency disease) ([place])   . Trigger finger   . Unspecified deficiency anemia   . Xerostomia     Outpatient Medications Prior to Visit  Medication Sig Dispense Refill  . acyclovir (ZOVIRAX) 400 MG tablet Take 400 mg by mouth 5 (five) times daily. As needed    . albuterol (PROAIR HFA) 108 (90 Base) MCG/ACT inhaler Inhale two puffs every four to six hours as needed for cough or wheeze. 8 g 1  . ARIPiprazole (ABILIFY) 30 MG tablet Take 30 mg by mouth daily.    . AZITHROMYCIN PO Take by mouth daily.    . budesonide-formoterol (SYMBICORT) 160-4.5 MCG/ACT inhaler INHALE 2 PUFFS BY MOUTH TWICE DAILY TO PREVENT COUGH OR WHEEZE. RINSE MOUTH AFTER USE 33 g 4  . clopidogrel (PLAVIX) 75 MG tablet Take 75 mg by mouth daily.    . clotrimazole-betamethasone (LOTRISONE) cream APPLY A SMALL AMOUNT OF CREAM TO AFFECTED AREA(S) TWICE DAILY  3  . EPINEPHrine 0.3 mg/0.3 mL IJ SOAJ injection Use as directed for life-threatening allergic reaction. 2 each 1  . Fe Fum-FA-B Cmp-C-Zn-Mg-Mn-Cu (HEMATINIC  PLUS COMPLEX) 106-1 MG TABS Take 1 capsule by mouth 2 (two) times daily.     . fenofibrate micronized (LOFIBRA) 134 MG capsule Take 134 mg by mouth daily.    . finasteride (PROPECIA) 1 MG tablet Take 1 mg by mouth daily.    . IMMUNE GLOBULIN 10% 10 GM/100ML SOLN   11  . ipratropium-albuterol (DUONEB) 0.5-2.5 (3) MG/3ML SOLN USE 1 VIAL IN NEBULIZER EVERY 4 TO 6 HOURS AS NEEDED FOR COUGH OR WHEEZING 180 mL 1  . levothyroxine (SYNTHROID, LEVOTHROID) 100 MCG tablet Take 100 mcg by mouth daily before breakfast.    . meloxicam (MOBIC) 7.5 MG tablet TAKE 1 TABLET BY MOUTH ONCE TO TWICE DAILY AS NEEDED FOR PAIN TENDONITIS  1  . methocarbamol (ROBAXIN) 500 MG tablet TAKE 2 TABLETS BY MOUTH 4 TIMES DAILY AS NEEDED FOR MUSCLE SPASM    . metoprolol tartrate (LOPRESSOR) 25 MG tablet Take 25 mg by mouth 2 (two) times daily.    . montelukast (SINGULAIR) 10 MG tablet Take  10 mg by mouth at bedtime.    . Multiple Vitamin (MULTI-VITAMIN) tablet Take by mouth.    . nitroGLYCERIN (NITROSTAT) 0.4 MG SL tablet Place 0.4 mg under the tongue as needed.    Marland Kitchen omeprazole (PRILOSEC) 40 MG capsule Take 1 capsule by mouth twice daily 180 capsule 1  . oxyCODONE-acetaminophen (PERCOCET) 10-325 MG tablet Take 1 tablet by mouth 4 (four) times daily.     Vladimir Faster Glycol-Propyl Glycol (SYSTANE OP) Apply to eye every 4 (four) hours.    . predniSONE (DELTASONE) 10 MG tablet Take 10 mg by mouth 2 (two) times daily.    Marland Kitchen PRIVIGEN 40 GM/400ML SOLN INFUSE INTRAVENOUS 100 GRAMS OVER 4-8 HOURS AS TOLERATED EVERY 3 WEEKS 1000 mL 11  . rosuvastatin (CRESTOR) 5 MG tablet Take 5 mg by mouth daily.  2  . sertraline (ZOLOFT) 100 MG tablet Take 100 mg by mouth daily.     . traZODone (DESYREL) 100 MG tablet Take 200 mg by mouth at bedtime.    Marland Kitchen VASCEPA 1 g CAPS TAKE 2 CAPSULES BY MOUTH TWICE DAILY WITH FOOD SWALLOW WHOLE. DO NOT CHEW OPEN DISSOLVE AND OR CRUSH  2  . zolpidem (AMBIEN CR) 12.5 MG CR tablet Take 12.5 mg by mouth at bedtime.         Objective:     BP 90/60 (BP Location: Left Arm, Cuff Size: Normal)   Pulse 82   Temp (!) 97.3 F (36.3 C) (Oral)   Ht 5' 6.5" (1.689 m)   Wt 154 lb (69.9 kg)   SpO2 98% Comment: on 2lpm cont o2  BMI 24.48 kg/m        HEENT : pt wearing mask not removed for exam due to covid - 19 concerns.   NECK :  without JVD/Nodes/TM/ nl carotid upstrokes bilaterally   LUNGS: no acc muscle use,  Mild barrel  contour chest wall with bilateral  Distant bs s audible wheeze and  without cough on insp or exp maneuvers  and mild  Hyperresonant  to  percussion bilaterally     CV:  RRR  no s3 or murmur or increase in P2, and no edema   ABD:  soft and nontender with pos end  insp Hoover's  in the supine position. No bruits or organomegaly appreciated, bowel sounds nl  MS:   Nl gait/  ext warm without deformities, calf tenderness, cyanosis or clubbing No obvious joint restrictions   SKIN: warm and dry without lesions    NEURO:  alert, approp, nl sensorium with  no motor or cerebellar deficits apparent.         I personally reviewed images and agree with radiology impression as follows:   Chest CT 07/01/19 1. Patchy peribronchovascular, perifissural and subpleural fine nodularity throughout both lungs, with a slight upper lung predominance, unchanged, associated with mild traction bronchiectasis and parenchymal distortion. This spectrum of findings is most suggestive of sarcoidosis, with a differential including pneumoconiosis. 2. Previously described dominant solid pulmonary nodules in the right lung, largest 1.0 cm, stable to mildly decreased since 05/24/2027 chest CT. Suggest follow-up high-resolution chest CT in 6 months. 3. Stable mosaic attenuation throughout both lungs, which could be due to pulmonary vascular disease given the dilated main pulmonary artery, versus air trapping from small airways disease. 4. Prominently dilated main pulmonary artery, suggesting pulmonary  arterial hypertension. 5. Stable mild mediastinal adenopathy, nonspecific     Assessment   COPD GOLD III with bronchiectasis / asthma components Quit smoking  1996 - CVID dx around 2010/ f/u by Isaac Walsh - Alpha one screen 08/14/14  MM  Level 172 - PFT's  02/22/2019  FEV1 1.65   ratio 0.69   with DLCO  79%  corrects to 107%   - Spirometry 08/22/2019  FEV1 1.02 (32%)  Ratio 0..59 with classic curvature - 08/22/2019  After extensive coaching inhaler device,  effectiveness =   50% from a baseline of 25% (Ti too short)  > try reduce symb to 80 bid   His main concern is severe coughing fits despite approp rx by prior Pulmonologist c/w dtca COPD/AB/Bronchiectasis  DDX of  difficult airways management almost all start with A and  include Adherence, Ace Inhibitors, Acid Reflux, Active Sinus Disease, Alpha 1 Antitripsin deficiency, Anxiety masquerading as Airways dz,  ABPA,  Allergy(esp in young), Aspiration (esp in elderly), Adverse effects of meds,  Active smoking or vaping, A bunch of PE's (a small clot burden can't cause this syndrome unless there is already severe underlying pulm or vascular dz with poor reserve) plus two Bs  = Bronchiectasis and Beta blocker use..and one C= CHF   Adherence is always the initial "prime suspect" and is a multilayered concern that requires a "trust but verify" approach in every patient - starting with knowing how to use medications, especially inhalers, correctly, keeping up with refills and understanding the fundamental difference between maintenance and prns vs those medications only taken for a very short course and then stopped and not refilled.  - see hfa teaching  - if can't learn better technique would have low threshold to neb laba/ics and even consider lama trial at some point.  - return with all meds/devices (not vest)  in hand using a trust but verify approach to confirm accurate Medication  Reconciliation The principal here is that until we are certain that the   patients are doing what we've asked, it makes no sense to ask them to do more.    ? Acid (or non-acid) GERD > always difficult to exclude as up to 75% of pts in some series report no assoc GI/ Heartburn symptoms> rec max (24h)  acid suppression and diet restrictions/ reviewed and instructions given in writing.   ? Aspiration > needs MBS next step   ? Allergy/asthma > on singulair per Dr Neldon Mc,  try lower dose of ics to see if, used optimally in terms of technique, actually improves his main complaint = cough    ? Bronchiectasis/ superinfection including pseudomonas/ MAI > maintained on Zpak and s/p 3 courses levaquin>>>  consider FOB but keep in mind the last tube he had in his throat (ET) made his problem much worse- would definitely pursue this prior to next prolonged rx with abx stronger than zmax and consider fob off zmax at least 2 weeks   ? Chf > last echo looked ok      Cough variant asthma Not clear how much of the cough is asthma related and clearly appears to be evolving more of a copd patter at present  (see separate a/p)      Multiple pulmonary nodules determined by computed tomography of lung CT chest 07/01/2019 : Previously described dominant solid pulmonary nodules in the right lung, largest 1.0 cm, stable to mildly decreased since 05/24/2027 chest CT. Suggest follow-up high-resolution chest CT in 6 Months.  This is an extremely common benign condition   and does not warrant aggressive eval/ rx at this point unless there is a clinical correlation suggesting unaddressed  pulmonary infection (purulent sputum, night sweats, unintended wt loss, doe) or evolution of  obvious changes on plain cxr (as opposed to serial CT, which is way over sensitive to make clinical decisions re intervention and treatment    Discussed in detail all the  indications, usual  risks and alternatives  relative to the benefits with patient who agrees to proceed with conservative f/u and hold off on  fob for now.     Total time devoted to counseling  > 50 % of initial 60 min office visit:  review case with pt/  performed device teaching  using a teach back technique which also  extended face to face time for this visit (see above)  discussion of options/alternatives/ personally creating written customized instructions  in presence of pt  then going over those specific  Instructions directly with the pt including how to use all of the meds but in particular covering each new medication in detail and the difference between the maintenance= "automatic" meds and the prns using an action plan format for the latter (If this problem/symptom => do that organization reading Left to right).  Please see AVS from this visit for a full list of these instructions which I personally wrote for this pt and  are unique to this visit.     Christinia Gully, MD 08/22/2019

## 2019-08-23 ENCOUNTER — Telehealth: Payer: Self-pay | Admitting: *Deleted

## 2019-08-23 ENCOUNTER — Encounter: Payer: Self-pay | Admitting: Internal Medicine

## 2019-08-23 DIAGNOSIS — R059 Cough, unspecified: Secondary | ICD-10-CM

## 2019-08-23 DIAGNOSIS — D839 Common variable immunodeficiency, unspecified: Secondary | ICD-10-CM | POA: Diagnosis not present

## 2019-08-23 DIAGNOSIS — R05 Cough: Secondary | ICD-10-CM

## 2019-08-23 DIAGNOSIS — R918 Other nonspecific abnormal finding of lung field: Secondary | ICD-10-CM | POA: Insufficient documentation

## 2019-08-23 DIAGNOSIS — B999 Unspecified infectious disease: Secondary | ICD-10-CM | POA: Diagnosis not present

## 2019-08-23 NOTE — Telephone Encounter (Signed)
-----  Message from Tanda Rockers, MD sent at 08/23/2019  5:47 AM EDT ----- Typo on avs Prilosec 40 mg Take 30-60 min before first meal of the day  Add: should have said  Take 30- 60 min before your first and last meals of the day

## 2019-08-23 NOTE — Assessment & Plan Note (Signed)
CT chest 07/01/2019 : Previously described dominant solid pulmonary nodules in the right lung, largest 1.0 cm, stable to mildly decreased since 05/24/2027 chest CT. Suggest follow-up high-resolution chest CT in 6 Months.  This is an extremely common benign condition   and does not warrant aggressive eval/ rx at this point unless there is a clinical correlation suggesting unaddressed pulmonary infection (purulent sputum, night sweats, unintended wt loss, doe) or evolution of  obvious changes on plain cxr (as opposed to serial CT, which is way over sensitive to make clinical decisions re intervention and treatment    Discussed in detail all the  indications, usual  risks and alternatives  relative to the benefits with patient who agrees to proceed with conservative f/u and hold off on fob for now.     Total time devoted to counseling  > 50 % of initial 60 min office visit:  review case with pt/  performed device teaching  using a teach back technique which also  extended face to face time for this visit (see above)  discussion of options/alternatives/ personally creating written customized instructions  in presence of pt  then going over those specific  Instructions directly with the pt including how to use all of the meds but in particular covering each new medication in detail and the difference between the maintenance= "automatic" meds and the prns using an action plan format for the latter (If this problem/symptom => do that organization reading Left to right).  Please see AVS from this visit for a full list of these instructions which I personally wrote for this pt and  are unique to this visit.

## 2019-08-23 NOTE — Telephone Encounter (Signed)
Sinus ct at his convenience   He max rx for gerd/ max rx for mucinex dm/    try use the flutter correctly and either use the spacer with sym or not but either way bring both back to clinic so we can verify he's using these correctly then schedule fob/ vest at that ov   Says swallowing better so no need for MBS

## 2019-08-23 NOTE — Assessment & Plan Note (Signed)
Not clear how much of the cough is asthma related and clearly appears to be evolving more of a copd patter at present  (see separate a/p)

## 2019-08-23 NOTE — Assessment & Plan Note (Addendum)
Quit smoking 1996 - CVID dx around 2010/ f/u by Kozlow - Alpha one screen 08/14/14  MM  Level 172 - PFT's  02/22/2019  FEV1 1.65   ratio 0.69   with DLCO  79%  corrects to 107%   - Spirometry 08/22/2019  FEV1 1.02 (32%)  Ratio 0..59 with classic curvature - 08/22/2019  After extensive coaching inhaler device,  effectiveness =   50% from a baseline of 25% (Ti too short)  > try reduce symb to 80 bid   His main concern is severe coughing fits despite approp rx by prior Pulmonologist c/w dtca COPD/AB/Bronchiectasis  DDX of  difficult airways management almost all start with A and  include Adherence, Ace Inhibitors, Acid Reflux, Active Sinus Disease, Alpha 1 Antitripsin deficiency, Anxiety masquerading as Airways dz,  ABPA,  Allergy(esp in young), Aspiration (esp in elderly), Adverse effects of meds,  Active smoking or vaping, A bunch of PE's (a small clot burden can't cause this syndrome unless there is already severe underlying pulm or vascular dz with poor reserve) plus two Bs  = Bronchiectasis and Beta blocker use..and one C= CHF   Adherence is always the initial "prime suspect" and is a multilayered concern that requires a "trust but verify" approach in every patient - starting with knowing how to use medications, especially inhalers, correctly, keeping up with refills and understanding the fundamental difference between maintenance and prns vs those medications only taken for a very short course and then stopped and not refilled.  - see hfa teaching  - if can't learn better technique would have low threshold to neb laba/ics and even consider lama trial at some point.  - return with all meds/devices (not vest)  in hand using a trust but verify approach to confirm accurate Medication  Reconciliation The principal here is that until we are certain that the  patients are doing what we've asked, it makes no sense to ask them to do more.    ? Acid (or non-acid) GERD > always difficult to exclude as up to 75%  of pts in some series report no assoc GI/ Heartburn symptoms> rec max (24h)  acid suppression and diet restrictions/ reviewed and instructions given in writing.   ? Aspiration > needs MBS next step    ? Allergy/asthma > on singulair per Dr Neldon Mc,  try lower dose of ics to see if, used optimally in terms of technique, actually improves his main complaint = cough     ? Bronchiectasis/ superinfection including pseudomonas/ MAI > maintained on Zpak and s/p 3 courses levaquin>>>  consider FOB but keep in mind the last tube he had in his throat (ET) made his problem much worse- would definitely pursue this prior to next prolonged rx with abx stronger than zmax and consider fob off zmax at least 2 weeks    ? Chf > last echo looked ok

## 2019-08-23 NOTE — Telephone Encounter (Signed)
Sinus ct was ordered  Pt aware

## 2019-08-23 NOTE — Telephone Encounter (Signed)
Spoke with the pt and notified of recs and he verbalized understanding  He wants to know what the plan for f/u is  He is asking if he needs any abx and states the last MD he was seen by had mentioned that he needed a VEST  Please advise thanks

## 2019-08-23 NOTE — Progress Notes (Signed)
Spoke with pt and notified of results per Dr. Wert. Pt verbalized understanding and denied any questions. 

## 2019-08-24 DIAGNOSIS — G894 Chronic pain syndrome: Secondary | ICD-10-CM | POA: Diagnosis not present

## 2019-08-24 DIAGNOSIS — M47817 Spondylosis without myelopathy or radiculopathy, lumbosacral region: Secondary | ICD-10-CM | POA: Diagnosis not present

## 2019-08-24 DIAGNOSIS — M8949 Other hypertrophic osteoarthropathy, multiple sites: Secondary | ICD-10-CM | POA: Diagnosis not present

## 2019-08-24 LAB — CBC WITH DIFFERENTIAL/PLATELET
Basophils Absolute: 0 10*3/uL (ref 0.0–0.2)
Basos: 0 %
EOS (ABSOLUTE): 0.6 10*3/uL — ABNORMAL HIGH (ref 0.0–0.4)
Eos: 5 %
Hematocrit: 31.6 % — ABNORMAL LOW (ref 37.5–51.0)
Hemoglobin: 9.1 g/dL — ABNORMAL LOW (ref 13.0–17.7)
Immature Grans (Abs): 0 10*3/uL (ref 0.0–0.1)
Immature Granulocytes: 0 %
Lymphocytes Absolute: 0.7 10*3/uL (ref 0.7–3.1)
Lymphs: 6 %
MCH: 23.8 pg — ABNORMAL LOW (ref 26.6–33.0)
MCHC: 28.8 g/dL — ABNORMAL LOW (ref 31.5–35.7)
MCV: 83 fL (ref 79–97)
Monocytes Absolute: 0.6 10*3/uL (ref 0.1–0.9)
Monocytes: 5 %
Neutrophils Absolute: 9.7 10*3/uL — ABNORMAL HIGH (ref 1.4–7.0)
Neutrophils: 84 %
Platelets: 308 10*3/uL (ref 150–450)
RBC: 3.83 x10E6/uL — ABNORMAL LOW (ref 4.14–5.80)
RDW: 15 % (ref 11.6–15.4)
WBC: 11.5 10*3/uL — ABNORMAL HIGH (ref 3.4–10.8)

## 2019-08-24 LAB — IGG: IgG (Immunoglobin G), Serum: 805 mg/dL (ref 603–1613)

## 2019-08-25 ENCOUNTER — Other Ambulatory Visit: Payer: Self-pay

## 2019-08-25 ENCOUNTER — Ambulatory Visit (INDEPENDENT_AMBULATORY_CARE_PROVIDER_SITE_OTHER): Payer: Medicare HMO | Admitting: *Deleted

## 2019-08-25 DIAGNOSIS — J449 Chronic obstructive pulmonary disease, unspecified: Secondary | ICD-10-CM | POA: Diagnosis not present

## 2019-08-25 DIAGNOSIS — Z7982 Long term (current) use of aspirin: Secondary | ICD-10-CM | POA: Diagnosis not present

## 2019-08-25 DIAGNOSIS — I10 Essential (primary) hypertension: Secondary | ICD-10-CM | POA: Diagnosis not present

## 2019-08-25 DIAGNOSIS — Z951 Presence of aortocoronary bypass graft: Secondary | ICD-10-CM | POA: Diagnosis not present

## 2019-08-25 DIAGNOSIS — E785 Hyperlipidemia, unspecified: Secondary | ICD-10-CM | POA: Diagnosis not present

## 2019-08-25 DIAGNOSIS — J4550 Severe persistent asthma, uncomplicated: Secondary | ICD-10-CM | POA: Diagnosis not present

## 2019-08-25 DIAGNOSIS — G4733 Obstructive sleep apnea (adult) (pediatric): Secondary | ICD-10-CM | POA: Diagnosis not present

## 2019-08-25 DIAGNOSIS — I2510 Atherosclerotic heart disease of native coronary artery without angina pectoris: Secondary | ICD-10-CM | POA: Diagnosis not present

## 2019-08-25 MED ORDER — BENRALIZUMAB 30 MG/ML ~~LOC~~ SOSY
30.0000 mg | PREFILLED_SYRINGE | SUBCUTANEOUS | Status: AC
  Administered 2019-08-25 – 2019-09-22 (×2): 30 mg via SUBCUTANEOUS

## 2019-08-25 NOTE — Progress Notes (Signed)
Isaac Walsh started his [name] today. He will receive his first 3 injections every 4 weeks then once every 8 weeks. All information reviewed. Has Epipen.

## 2019-08-28 DIAGNOSIS — J449 Chronic obstructive pulmonary disease, unspecified: Secondary | ICD-10-CM | POA: Diagnosis not present

## 2019-08-29 ENCOUNTER — Encounter: Payer: Self-pay | Admitting: Internal Medicine

## 2019-08-29 ENCOUNTER — Ambulatory Visit (INDEPENDENT_AMBULATORY_CARE_PROVIDER_SITE_OTHER)
Admission: RE | Admit: 2019-08-29 | Discharge: 2019-08-29 | Disposition: A | Discharge status: Home / Self Care | Payer: Medicare HMO | Source: Ambulatory Visit | Attending: Internal Medicine | Admitting: Internal Medicine

## 2019-08-29 ENCOUNTER — Other Ambulatory Visit: Payer: Self-pay | Admitting: Internal Medicine

## 2019-08-29 ENCOUNTER — Other Ambulatory Visit: Payer: Self-pay

## 2019-08-29 DIAGNOSIS — J0100 Acute maxillary sinusitis, unspecified: Secondary | ICD-10-CM | POA: Diagnosis not present

## 2019-08-29 DIAGNOSIS — J329 Chronic sinusitis, unspecified: Secondary | ICD-10-CM | POA: Insufficient documentation

## 2019-08-29 DIAGNOSIS — R059 Cough, unspecified: Secondary | ICD-10-CM

## 2019-08-29 DIAGNOSIS — R05 Cough: Secondary | ICD-10-CM

## 2019-08-29 NOTE — Progress Notes (Signed)
Spoke with pt and notified of results per Dr. Melvyn Novas. Pt verbalized understanding and denied any questions.

## 2019-08-30 DIAGNOSIS — K5900 Constipation, unspecified: Secondary | ICD-10-CM | POA: Diagnosis not present

## 2019-08-30 DIAGNOSIS — M5430 Sciatica, unspecified side: Secondary | ICD-10-CM | POA: Diagnosis not present

## 2019-08-30 DIAGNOSIS — F419 Anxiety disorder, unspecified: Secondary | ICD-10-CM | POA: Diagnosis not present

## 2019-08-30 DIAGNOSIS — F319 Bipolar disorder, unspecified: Secondary | ICD-10-CM | POA: Diagnosis not present

## 2019-08-30 DIAGNOSIS — J449 Chronic obstructive pulmonary disease, unspecified: Secondary | ICD-10-CM | POA: Diagnosis not present

## 2019-08-30 DIAGNOSIS — J471 Bronchiectasis with (acute) exacerbation: Secondary | ICD-10-CM | POA: Diagnosis not present

## 2019-08-30 DIAGNOSIS — G4730 Sleep apnea, unspecified: Secondary | ICD-10-CM | POA: Diagnosis not present

## 2019-08-30 DIAGNOSIS — G8929 Other chronic pain: Secondary | ICD-10-CM | POA: Diagnosis not present

## 2019-08-30 DIAGNOSIS — E782 Mixed hyperlipidemia: Secondary | ICD-10-CM | POA: Diagnosis not present

## 2019-08-31 ENCOUNTER — Ambulatory Visit: Payer: Medicare HMO | Admitting: Allergy and Immunology

## 2019-09-01 ENCOUNTER — Ambulatory Visit (INDEPENDENT_AMBULATORY_CARE_PROVIDER_SITE_OTHER): Payer: Medicare HMO | Admitting: Allergy and Immunology

## 2019-09-01 ENCOUNTER — Other Ambulatory Visit: Payer: Self-pay

## 2019-09-01 ENCOUNTER — Encounter: Payer: Self-pay | Admitting: Allergy and Immunology

## 2019-09-01 VITALS — BP 96/56 | HR 76 | Resp 20

## 2019-09-01 DIAGNOSIS — J449 Chronic obstructive pulmonary disease, unspecified: Secondary | ICD-10-CM | POA: Diagnosis not present

## 2019-09-01 DIAGNOSIS — J324 Chronic pansinusitis: Secondary | ICD-10-CM

## 2019-09-01 DIAGNOSIS — D839 Common variable immunodeficiency, unspecified: Secondary | ICD-10-CM

## 2019-09-01 DIAGNOSIS — D72810 Lymphocytopenia: Secondary | ICD-10-CM | POA: Diagnosis not present

## 2019-09-01 DIAGNOSIS — J471 Bronchiectasis with (acute) exacerbation: Secondary | ICD-10-CM | POA: Diagnosis not present

## 2019-09-01 DIAGNOSIS — K219 Gastro-esophageal reflux disease without esophagitis: Secondary | ICD-10-CM

## 2019-09-01 MED ORDER — PREDNISONE 5 MG PO TABS: ORAL_TABLET | ORAL | 0 refills | Status: DC

## 2019-09-01 MED ORDER — CLINDAMYCIN HCL 300 MG PO CAPS: ORAL_CAPSULE | ORAL | 0 refills | Status: DC

## 2019-09-01 NOTE — Progress Notes (Signed)
[place] [facility] [place] [facility] [place]   Follow-up Note  Referring Provider: Rochel Brome, MD Primary Provider: Rochel Brome, MD Date of Office Visit: 09/01/2019  Subjective:   Isaac Walsh (DOB: [birth year]) is a 53 y.o. male who returns to the [facility] on 09/01/2019 in re-evaluation of the following:  HPI: Isaac Walsh returns to this clinic in reevaluation of hypogammaglobulinemia, lymphopenia, chronic inflammatory lung disease with a history of Pseudomonas respiratory tract colonization, bronchiectasis, and possible granulomatous inflammation, chronic sinusitis, untreated sleep apnea with nocturnal hypoxemia.  I last saw him in this clinic on 11 August 2019.  We administered benralizumab for his eosinophilia on 25 August 2019.  Interestingly, he thinks that his cough is actually a little bit better since that injection.  He is not coughing as hard.  In fact, he was able to sleep through last night without coughing.  He still continues to be very dyspneic and still has intermittent cough that exacerbates his abdominal hernia.  He still must use a bronchodilator nebulized 3 times per day.  He continues on a large collection of medical therapy directed against inflammation of his airway.  He continues on oxygen.  Currently he is using prednisone 10 mg daily.    Currently he is using 100 g of Privigen every 3 weeks.  Currently he is using a prescription of azithromycin for the past 2 months prescribed by [facility] pulmonology.  It should be noted that he has had lots of respiratory tract problems in the face of utilizing this azithromycin.  As well, he has had 3 courses of Levaquin administered sometime this past spring which has not helped him at all regarding his chronic respiratory tract symptoms..  His reflux appears to be under very good control at this point.  Allergies as of 09/01/2019      Reactions   Cephalosporins Hives   Other  reaction(s): Hives   Corticosteroids Hives   Quinolones Hives   Other reaction(s): Hives Other reaction(s): Hives   Betamethasone Rash, Hives   Other reaction(s): Hives, Rash   Cefaclor Dermatitis, Rash, Hives   Other reaction(s): Hives, Rash   Ceftriaxone Hives, Rash, Dermatitis   Other reaction(s): Hives, Rash   Ketorolac Other (See Comments), Rash, Hives   Other reaction(s): Hives, Rash Manic symptoms   Loratadine Dermatitis, Rash, Hives   Other reaction(s): Hives, Rash   Naproxen Rash, Hives   Other reaction(s): Hives, Rash Looks like shingles   Androgel Pump [testosterone]    rash   Avelox [moxifloxacin Hcl]    Avelox [moxifloxacin]    Blisters and "doesn't work for me" per pt   Effexor [venlafaxine]    Flumist [flu Virus Vaccine]    Pt reports he can tolerate flu shot but not mist   Other    Rocephin [ceftriaxone Sodium In Dextrose]    Tizanidine    Toradol [ketorolac Tromethamine]    Vicodin [hydrocodone-acetaminophen] Nausea Only      Medication List      acyclovir 400 MG tablet Commonly known as: ZOVIRAX Take 400 mg by mouth 5 (five) times daily. As needed   albuterol 108 (90 Base) MCG/ACT inhaler Commonly known as: ProAir HFA Inhale two puffs every four to six hours as needed for cough or wheeze.   ARIPiprazole 30 MG tablet Commonly known as: ABILIFY Take 30 mg by mouth daily.   AZITHROMYCIN PO Take by mouth daily.   budesonide-formoterol 80-4.5 MCG/ACT inhaler Commonly known as: Symbicort Inhale  2 puffs into the lungs 2 (two) times daily.   clopidogrel 75 MG tablet Commonly known as: PLAVIX Take 75 mg by mouth daily.   clotrimazole-betamethasone cream Commonly known as: LOTRISONE APPLY A SMALL AMOUNT OF CREAM TO AFFECTED AREA(S) TWICE DAILY   EPINEPHrine 0.3 mg/0.3 mL Soaj injection Commonly known as: EPI-PEN Use as directed for life-threatening allergic reaction.   fenofibrate micronized 134 MG capsule Commonly known as: LOFIBRA Take  134 mg by mouth daily.   finasteride 1 MG tablet Commonly known as: PROPECIA Take 1 mg by mouth daily.   Hematinic Plus Complex 106-1 MG Tabs Take 1 capsule by mouth 2 (two) times daily.   Immune Globulin 10% 10G/115m (10,0021m100mL) Soln Generic drug: Immune Globulin 10%   Privigen 40 GM/400ML Soln Generic drug: Immune Globulin (Human) INFUSE INTRAVENOUS 100 GRAMS OVER 4-8 HOURS AS TOLERATED EVERY 3 WEEKS   ipratropium-albuterol 0.5-2.5 (3) MG/3ML Soln Commonly known as: DUONEB USE 1 VIAL IN NEBULIZER EVERY 4 TO 6 HOURS AS NEEDED FOR COUGH OR WHEEZING   levothyroxine 100 MCG tablet Commonly known as: SYNTHROID Take 100 mcg by mouth daily before breakfast.   meloxicam 7.5 MG tablet Commonly known as: MOBIC TAKE 1 TABLET BY MOUTH ONCE TO TWICE DAILY AS NEEDED FOR PAIN TENDONITIS   methocarbamol 500 MG tablet Commonly known as: ROBAXIN TAKE 2 TABLETS BY MOUTH 4 TIMES DAILY AS NEEDED FOR MUSCLE SPASM   metoprolol tartrate 25 MG tablet Commonly known as: LOPRESSOR Take 25 mg by mouth 2 (two) times daily.   montelukast 10 MG tablet Commonly known as: SINGULAIR Take 10 mg by mouth at bedtime.   Multi-Vitamin tablet Take by mouth.   nitroGLYCERIN 0.4 MG SL tablet Commonly known as: NITROSTAT Place 0.4 mg under the tongue as needed.   omeprazole 40 MG capsule Commonly known as: PRILOSEC Take 1 capsule by mouth twice daily   oxyCODONE-acetaminophen 10-325 MG tablet Commonly known as: PERCOCET Take 1 tablet by mouth 4 (four) times daily.   predniSONE 10 MG tablet Commonly known as: DELTASONE Take 10 mg by mouth daily with breakfast.   rosuvastatin 5 MG tablet Commonly known as: CRESTOR Take 5 mg by mouth daily.   sertraline 100 MG tablet Commonly known as: ZOLOFT Take 100 mg by mouth daily.   SYSTANE OP Apply to eye every 4 (four) hours.   traZODone 100 MG tablet Commonly known as: DESYREL Take 200 mg by mouth at bedtime.   Vascepa 1 g Caps Generic  drug: Icosapent Ethyl TAKE 2 CAPSULES BY MOUTH TWICE DAILY WITH FOOD SWALLOW WHOLE. DO NOT CHEW OPEN DISSOLVE AND OR CRUSH   zolpidem 12.5 MG CR tablet Commonly known as: AMBIEN CR Take 12.5 mg by mouth at bedtime.       Past Medical History:  Diagnosis Date  . Abnormal heart rhythm   . Asthma   . Chronic pain syndrome   . Common variable immunodeficiency ([place]  . Congenital dysgammaglobulinemia ([facility] [place]  . COPD (chronic obstructive pulmonary disease) ([place]  . Depression with anxiety   . Fatigue   . GERD (gastroesophageal reflux disease)   . Hypercholesterolemia   . Hypothyroidism   . Lymphoma (HCP[place]   as a child  . Nonalcoholic fatty liver disease   . OSA (obstructive sleep apnea)   . Osgood-Schlatter's disease   . Other nonspecific abnormal finding of lung field   . Plantar fasciitis   . Renal stones   . Sciatica   . SCID (severe  combined immunodeficiency disease) ([place])   . Trigger finger   . Unspecified deficiency anemia   . Xerostomia     Past Surgical History:  Procedure Laterality Date  . CORONARY ARTERY BYPASS GRAFT    . LIVER SURGERY     10% of liver removed  . LYMPHADENECTOMY    . sinusectomy    . TYMPANOSTOMY TUBE PLACEMENT      Review of systems negative except as noted in HPI / PMHx or noted below:  Review of Systems  Constitutional: Negative.   HENT: Negative.   Eyes: Negative.   Respiratory: Negative.   Cardiovascular: Negative.   Gastrointestinal: Negative.   Genitourinary: Negative.   Musculoskeletal: Negative.   Skin: Negative.   Neurological: Negative.   Endo/Heme/Allergies: Negative.   Psychiatric/Behavioral: Negative.      Objective:   Vitals:   09/01/19 1143  BP: (!) 96/56  Pulse: 76  Resp: 20  SpO2: 98%          Physical Exam Constitutional:      Appearance: He is not diaphoretic.  HENT:     Head: Normocephalic. No right periorbital erythema or left periorbital erythema.     Right Ear: Tympanic membrane, ear  canal and external ear normal.     Left Ear: Tympanic membrane, ear canal and external ear normal.     Nose: No mucosal edema (Septal perforation) or rhinorrhea.     Mouth/Throat:     Pharynx: No oropharyngeal exudate.  Eyes:     General: Lids are normal.     Conjunctiva/sclera: Conjunctivae normal.     Pupils: Pupils are equal, round, and reactive to light.  Neck:     Thyroid: No thyromegaly.     Trachea: Trachea normal. No tracheal deviation.  Cardiovascular:     Rate and Rhythm: Normal rate and regular rhythm.     Heart sounds: Normal heart sounds, S1 normal and S2 normal. No murmur.  Pulmonary:     Effort: Pulmonary effort is normal. No respiratory distress.     Breath sounds: No stridor. Wheezing (Inspiratory and expiratory crackles left posterior midlung field) present. No rales.  Chest:     Chest wall: No tenderness.  Abdominal:     General: There is no distension.     Palpations: Abdomen is soft. There is no mass.     Tenderness: There is no abdominal tenderness. There is no guarding or rebound.  Musculoskeletal:        General: No tenderness.  Lymphadenopathy:     Head:     Right side of head: No tonsillar adenopathy.     Left side of head: No tonsillar adenopathy.     Cervical: No cervical adenopathy.  Skin:    Coloration: Skin is not pale.     Findings: No erythema or rash.     Nails: There is no clubbing.   Neurological:     Mental Status: He is alert.     Diagnostics:    Spirometry was performed and demonstrated an FEV1 of 1.0 at 31 % of predicted.  Results of blood tests obtained 23 August 2019 identified WBC 11.5, absolute eosinophil 600, absolute lymphocyte 700, hemoglobin 9.1, platelet 308, IgG 805 mg/DL  Results of a sinus CT scan obtained 29 August 2019 identified the following:  Frontal sinuses are clear. The patient has had previous functional endoscopic sinus surgery with ethmoidectomy and nasoantral windows. There is mucosal thickening  throughout the postsurgical ethmoid regions. There is circumferential mucosal thickening of  the maxillary sinuses with layering fluid. Sphenoid sinus is clear.  Assessment and Plan:   1. CVID (common variable immunodeficiency) (HCC)   2. Lymphopenia   3. COPD with asthma ([place])   4. Bronchiectasis with acute exacerbation ([place])   5. Chronic pansinusitis   6. Gastroesophageal reflux disease, esophagitis presence not specified     1. Continue Privagen infusions at 100G every 3 weeks.   2.  Continue Symbicort 80 -2 inhalations 2 times per day  3. Continue Montelukast 35m one tablet one time per day   4. Continue Omeprazole 40 twice a day  5. Continue Benralizumab injections  6. Use bronchodilator (Duoneb) or Proair HFA 2 puffs every 4-6 hours if needed.  7. Use 2 L nasal oxygen 24 hours a day  8. Start Clindamycin 300 mg 3 times a day for 3 weeks  9. Taper prednisone - 5 mg tablets. Decrease to 1 + 1/2 tablet 1 time per day (7.5 mg per day)  10. Return to clinic in 4 weeks or earlier if probelm   11. Obtain fall flu vaccine (and COVID vaccine)     CJaquiscontinues with chronic inflammation and low-grade bacterial growth within his airway even in the face of utilizing a very large collection of medical therapy as noted above.  He did have eosinophilia and assuming that he has eosinophilic infiltration of his airway I have started him on benralizumab.  We will treat him with prolonged clindamycin to hopefully decrease his bacterial airway burden.  The one issue about utilizing such prolonged antibiotic administration is that it may predispose him to redevelopment of Pseudomonas growth.  If that is the case then we will need to obtain sensitivities and treat him with an antipseudomonal agents.  He has been on azithromycin for 2 months prescribed [facility] and I do not think that this agent has helped him at all and I did [facility]  discontinuing this agent.  We will begin to taper his systemic steroids.  I will see him back in this clinic in 4 weeks or earlier if there is a problem.  EAllena Katz MD Allergy / Immunology [place]

## 2019-09-01 NOTE — Patient Instructions (Addendum)
  1. Continue Privagen infusions at 100G every 3 weeks.   2.  Continue Symbicort 80 -2 inhalations 2 times per day  3. Continue Montelukast 45m one tablet one time per day   4. Continue Omeprazole 40 twice a day  5. Continue Benralizumab injections  6. Use bronchodilator (Duoneb) or Proair HFA 2 puffs every 4-6 hours if needed.  7. Use 2 L nasal oxygen 24 hours a day  8. Start Clindamycin 300 mg 3 times a day for 3 weeks  9. Taper prednisone - 5 mg tablets. Decrease to 1 + 1/2 tablet 1 time per day (7.5 mg per day)  10. Return to clinic in 4 weeks or earlier if probelm   11. Obtain fall flu vaccine (and COVID vaccine)

## 2019-09-06 ENCOUNTER — Encounter: Payer: Self-pay | Admitting: Allergy and Immunology

## 2019-09-12 ENCOUNTER — Telehealth: Payer: Self-pay | Admitting: Allergy and Immunology

## 2019-09-12 MED ORDER — NYSTATIN 100000 UNIT/ML MT SUSP: OROMUCOSAL | 0 refills | Status: AC

## 2019-09-12 MED ORDER — FLUCONAZOLE 150 MG PO TABS: ORAL_TABLET | ORAL | 0 refills | Status: DC

## 2019-09-12 NOTE — Telephone Encounter (Signed)
Let us have Isaac Walsh treat thrush with the following combination:  A.  Diflucan 150 -1 tablet now and repeat in 7 days and 14 days (3 doses) B.  Nystatin oral solution- swish and swallow 5 mL's 3 times a day for 14 days  There may be interactions between Diflucan and his other medications (abilify, others). It may be best to consider lowering doses of abilify while using. We are trying to keep dose of diflucan low only administering once every week. This medication will stay around for a week or so in the body.

## 2019-09-12 NOTE — Telephone Encounter (Signed)
Danarius called in and states he believes Clindamycin is causing thrush in his mouth.  He states his gums are also sore and he can barely brush them.  He states he was taking Nystatin that he had left over but it is not working. He would like something called in to [facility] in [place].

## 2019-09-12 NOTE — Telephone Encounter (Signed)
Called and spoke with Isaac Walsh regarding this situation. Informed him of Dr. Bruna Potter suggestions and he voiced understanding. Dr. Neldon Mc suggested that Isaac Walsh lower his Abilify by half during the course of his Diflucan treatment. Isaac Walsh voiced understanding and had no further questions at this time.

## 2019-09-13 DIAGNOSIS — G894 Chronic pain syndrome: Secondary | ICD-10-CM | POA: Diagnosis not present

## 2019-09-13 DIAGNOSIS — F3181 Bipolar II disorder: Secondary | ICD-10-CM | POA: Diagnosis not present

## 2019-09-13 DIAGNOSIS — R7301 Impaired fasting glucose: Secondary | ICD-10-CM | POA: Diagnosis not present

## 2019-09-13 DIAGNOSIS — J471 Bronchiectasis with (acute) exacerbation: Secondary | ICD-10-CM | POA: Diagnosis not present

## 2019-09-13 DIAGNOSIS — I2510 Atherosclerotic heart disease of native coronary artery without angina pectoris: Secondary | ICD-10-CM | POA: Diagnosis not present

## 2019-09-13 DIAGNOSIS — Z23 Encounter for immunization: Secondary | ICD-10-CM | POA: Diagnosis not present

## 2019-09-13 DIAGNOSIS — R160 Hepatomegaly, not elsewhere classified: Secondary | ICD-10-CM | POA: Diagnosis not present

## 2019-09-13 DIAGNOSIS — E782 Mixed hyperlipidemia: Secondary | ICD-10-CM | POA: Diagnosis not present

## 2019-09-13 DIAGNOSIS — G4701 Insomnia due to medical condition: Secondary | ICD-10-CM | POA: Diagnosis not present

## 2019-09-14 ENCOUNTER — Telehealth: Payer: Self-pay | Admitting: *Deleted

## 2019-09-14 NOTE — Telephone Encounter (Signed)
Isaac Walsh got his flu shot yesterday as well as his infusion yesterday. He states that since receiving the flu shot, he has felt terrible. He has major shortness of breath and his O2 levels have been low. He is taking all medications as directed. Please advise. He is concerned that the flu shot is interacting with something he is taking.

## 2019-09-15 DIAGNOSIS — G894 Chronic pain syndrome: Secondary | ICD-10-CM | POA: Diagnosis not present

## 2019-09-15 DIAGNOSIS — M8949 Other hypertrophic osteoarthropathy, multiple sites: Secondary | ICD-10-CM | POA: Diagnosis not present

## 2019-09-15 DIAGNOSIS — M47817 Spondylosis without myelopathy or radiculopathy, lumbosacral region: Secondary | ICD-10-CM | POA: Diagnosis not present

## 2019-09-15 NOTE — Telephone Encounter (Signed)
Please inform patient that he may be having some adverse effect from the flu vaccine but he also has very significant lung condition and a heart condition.  He should continue on the therapy as he has been doing and hopefully over the course of this weekend he will do much better.  If he starts developing any type of swelling of his legs or chest pain then he probably needs to seek out emergent evaluation as it may be his heart.  Have him keep in contact with [place] noting how he does over the next several days.

## 2019-09-15 NOTE — Telephone Encounter (Signed)
No fever-lots of coughing and major shortness of breath. His O2 levels are on average low-mid 90s but first thing in the morning while he is coughing really hard, he states that it drops into the 80s and has even gotten into the 70s. Once he gets through the coughing spell, his levels go back into the low-mid 90s. While we were speaking on the phone it was 95%.  He is currently using 3L of O2 while at home and 2L when he has to use his portable tanks when he is away from home.

## 2019-09-15 NOTE — Telephone Encounter (Signed)
Please have patient tell us what his level of oxygen has been lately.  Has he been having a fever?  Are his respiratory tract symptoms any better with his therapy?

## 2019-09-15 NOTE — Telephone Encounter (Signed)
Informed of instructions. I told Gerald Stabs that we do have an on-call doctor at all times after-hours that he can contact if needed.

## 2019-09-19 DIAGNOSIS — J449 Chronic obstructive pulmonary disease, unspecified: Secondary | ICD-10-CM | POA: Diagnosis not present

## 2019-09-22 ENCOUNTER — Other Ambulatory Visit: Payer: Self-pay

## 2019-09-22 ENCOUNTER — Ambulatory Visit (INDEPENDENT_AMBULATORY_CARE_PROVIDER_SITE_OTHER): Payer: Medicare HMO | Admitting: *Deleted

## 2019-09-22 DIAGNOSIS — J4550 Severe persistent asthma, uncomplicated: Secondary | ICD-10-CM | POA: Diagnosis not present

## 2019-09-26 DIAGNOSIS — F411 Generalized anxiety disorder: Secondary | ICD-10-CM | POA: Diagnosis not present

## 2019-09-28 DIAGNOSIS — D509 Iron deficiency anemia, unspecified: Secondary | ICD-10-CM | POA: Diagnosis not present

## 2019-09-28 DIAGNOSIS — D839 Common variable immunodeficiency, unspecified: Secondary | ICD-10-CM | POA: Diagnosis not present

## 2019-09-28 DIAGNOSIS — D649 Anemia, unspecified: Secondary | ICD-10-CM | POA: Diagnosis not present

## 2019-09-28 DIAGNOSIS — J209 Acute bronchitis, unspecified: Secondary | ICD-10-CM | POA: Diagnosis not present

## 2019-09-28 DIAGNOSIS — D72829 Elevated white blood cell count, unspecified: Secondary | ICD-10-CM | POA: Diagnosis not present

## 2019-09-28 DIAGNOSIS — J449 Chronic obstructive pulmonary disease, unspecified: Secondary | ICD-10-CM | POA: Diagnosis not present

## 2019-09-29 ENCOUNTER — Telehealth: Payer: Self-pay | Admitting: *Deleted

## 2019-09-29 MED ORDER — PREDNISONE 5 MG PO TABS: ORAL_TABLET | ORAL | 0 refills | Status: AC

## 2019-09-29 MED ORDER — FASENRA PEN 30 MG/ML ~~LOC~~ SOAJ: 30.0000 mg | SUBCUTANEOUS | 8 refills | Status: AC

## 2019-09-29 NOTE — Telephone Encounter (Signed)
Called patient and advised approval and Rx to [facility] for the [place] autoinjector and he wants to bring same in monthly for administration. I gave instrux on storage of same.  He also needs refill on prednisone and I will go ahead and send same to [facility] sent to [facility] for [place] pen

## 2019-10-02 DIAGNOSIS — D509 Iron deficiency anemia, unspecified: Secondary | ICD-10-CM | POA: Diagnosis not present

## 2019-10-03 DIAGNOSIS — D509 Iron deficiency anemia, unspecified: Secondary | ICD-10-CM | POA: Diagnosis not present

## 2019-10-04 DIAGNOSIS — D509 Iron deficiency anemia, unspecified: Secondary | ICD-10-CM | POA: Diagnosis not present

## 2019-10-05 ENCOUNTER — Encounter: Payer: Self-pay | Admitting: Allergy and Immunology

## 2019-10-05 ENCOUNTER — Ambulatory Visit (INDEPENDENT_AMBULATORY_CARE_PROVIDER_SITE_OTHER): Payer: Medicare HMO | Admitting: Allergy and Immunology

## 2019-10-05 ENCOUNTER — Other Ambulatory Visit: Payer: Self-pay

## 2019-10-05 VITALS — BP 94/54 | HR 84 | Temp 98.0°F | Resp 22

## 2019-10-05 DIAGNOSIS — J471 Bronchiectasis with (acute) exacerbation: Secondary | ICD-10-CM | POA: Diagnosis not present

## 2019-10-05 DIAGNOSIS — J4550 Severe persistent asthma, uncomplicated: Secondary | ICD-10-CM

## 2019-10-05 DIAGNOSIS — J324 Chronic pansinusitis: Secondary | ICD-10-CM | POA: Diagnosis not present

## 2019-10-05 DIAGNOSIS — D72810 Lymphocytopenia: Secondary | ICD-10-CM

## 2019-10-05 DIAGNOSIS — B999 Unspecified infectious disease: Secondary | ICD-10-CM

## 2019-10-05 DIAGNOSIS — D839 Common variable immunodeficiency, unspecified: Secondary | ICD-10-CM | POA: Diagnosis not present

## 2019-10-05 NOTE — Patient Instructions (Addendum)
  1. Continue Privagen infusions at 100G every 3 weeks.   2.  Continue Symbicort 80 -2 inhalations 2 times per day  3. Continue Montelukast 73m one tablet one time per day   4. Continue Omeprazole 40 twice a day  5. Continue Benralizumab injections  6. Use nystatin 5 mL swish and swallow after every Symbicort use  7. Use 3 L nasal oxygen 24 hours a day  8.  Taper prednisone 5 mg tablet 1 tablet 1 time per day (7.5 mg per day)  9. Sputum for AFB smear, bacterial culture and mycobacterial culture.  10. Use bronchodilator (Duoneb) or Proair HFA 2 puffs every 4-6 hours if needed.  11. Will discuss with pulmonary about a bronchoscopy  12. Return to clinic in 4 weeks or earlier if probelm   13. Obtain fall flu vaccine (and COVID vaccine)

## 2019-10-05 NOTE — Progress Notes (Signed)
[place] [facility] [place] [facility] [place]   Follow-up Note  Referring Provider: Rochel Brome, MD Primary Provider: Rochel Brome, MD Date of Office Visit: 10/05/2019  Subjective:   Isaac Walsh (DOB: 25-Aug-1966) is a 53 y.o. male who returns to the [facility] on 10/05/2019 in re-evaluation of the following:  HPI: Antiono returns to this clinic in evaluation of hypogammaglobulinemia with lymphopenia secondary to see CVID, chronic inflammatory lung disease with a history of Pseudomonas respiratory tract colonization and bronchiectasis and possible granulomatous/lymphoid inflammation, chronic sinusitis, untreated sleep apnea, and hypoxemia.  His last visit to this clinic was 01 September 2019.  During his last visit we attempted to taper his chronic systemic steroid dose down to 7.5 mg daily.  He developed a problem with rather significant thrush during the interval and we gave him Diflucan 1 time per week.  He was instructed to take clindamycin for 3 weeks during his last visit but unfortunately developed some type of side effect which at this point in time i is not completely defined and his hematologist involved with his recent iron infusion gave him clarithromycin and he is now on his seventh day of this antibiotic.  He cannot breathe well.  He has cough.  He has sputum production.  He is completely dyspneic when he exerts himself.  He just feels awful.  He has been very good about continuing all medical therapy as previously prescribed directed against respiratory tract inflammation and reflux and of course directed against his hypogammaglobulinemia with immunoglobulin infusions.  Allergies as of 10/05/2019      Reactions   Cephalosporins Hives   Other reaction(s): Hives   Corticosteroids Hives   Quinolones Hives   Other reaction(s): Hives Other reaction(s): Hives   Betamethasone Rash, Hives   Other reaction(s): Hives, Rash   Cefaclor  Dermatitis, Rash, Hives   Other reaction(s): Hives, Rash   Ceftriaxone Hives, Rash, Dermatitis   Other reaction(s): Hives, Rash   Ketorolac Other (See Comments), Rash, Hives   Other reaction(s): Hives, Rash Manic symptoms   Loratadine Dermatitis, Rash, Hives   Other reaction(s): Hives, Rash   Naproxen Rash, Hives   Other reaction(s): Hives, Rash Looks like shingles   Androgel Pump [testosterone]    rash   Avelox [moxifloxacin Hcl]    Avelox [moxifloxacin]    Blisters and "doesn't work for me" per pt   Effexor [venlafaxine]    Flumist [flu Virus Vaccine]    Pt reports he can tolerate flu shot but not mist   Other    Rocephin [ceftriaxone Sodium In Dextrose]    Tizanidine    Toradol [ketorolac Tromethamine]    Vicodin [hydrocodone-acetaminophen] Nausea Only      Medication List      acyclovir 400 MG tablet Commonly known as: ZOVIRAX Take 400 mg by mouth 5 (five) times daily. As needed   albuterol 108 (90 Base) MCG/ACT inhaler Commonly known as: ProAir HFA Inhale two puffs every four to six hours as needed for cough or wheeze.   ARIPiprazole 30 MG tablet Commonly known as: ABILIFY Take 30 mg by mouth daily.   budesonide-formoterol 80-4.5 MCG/ACT inhaler Commonly known as: Symbicort Inhale 2 puffs into the lungs 2 (two) times daily.   clopidogrel 75 MG tablet Commonly known as: PLAVIX Take 75 mg by mouth daily.   clotrimazole-betamethasone cream Commonly known as: LOTRISONE APPLY A SMALL AMOUNT OF CREAM TO AFFECTED AREA(S) TWICE DAILY   EPINEPHrine 0.3 mg/0.3 mL Soaj  injection Commonly known as: EPI-PEN Use as directed for life-threatening allergic reaction.   Fasenra Pen 30 MG/ML Soaj Generic drug: Benralizumab Inject 30 mg into the skin every 28 (twenty-eight) days. For 3 doses then every 8 weeks   fenofibrate micronized 134 MG capsule Commonly known as: LOFIBRA Take 134 mg by mouth daily.   finasteride 1 MG tablet Commonly known as: PROPECIA Take 1  mg by mouth daily.   Hematinic Plus Complex 106-1 MG Tabs Take 1 capsule by mouth 2 (two) times daily.   Immune Globulin 10% 10G/1561m (10,0056m100mL) Soln Generic drug: Immune Globulin 10%   Privigen 40 GM/400ML Soln Generic drug: Immune Globulin (Human) INFUSE INTRAVENOUS 100 GRAMS OVER 4-8 HOURS AS TOLERATED EVERY 3 WEEKS   ipratropium-albuterol 0.5-2.5 (3) MG/3ML Soln Commonly known as: DUONEB USE 1 VIAL IN NEBULIZER EVERY 4 TO 6 HOURS AS NEEDED FOR COUGH OR WHEEZING   levothyroxine 100 MCG tablet Commonly known as: SYNTHROID Take 100 mcg by mouth daily before breakfast.   meloxicam 7.5 MG tablet Commonly known as: MOBIC TAKE 1 TABLET BY MOUTH ONCE TO TWICE DAILY AS NEEDED FOR PAIN TENDONITIS   methocarbamol 500 MG tablet Commonly known as: ROBAXIN TAKE 2 TABLETS BY MOUTH 4 TIMES DAILY AS NEEDED FOR MUSCLE SPASM   metoprolol tartrate 25 MG tablet Commonly known as: LOPRESSOR Take 25 mg by mouth 2 (two) times daily.   montelukast 10 MG tablet Commonly known as: SINGULAIR Take 10 mg by mouth at bedtime.   Multi-Vitamin tablet Take by mouth.   nitroGLYCERIN 0.4 MG SL tablet Commonly known as: NITROSTAT Place 0.4 mg under the tongue as needed.   nystatin 100000 UNIT/ML suspension Commonly known as: MYCOSTATIN Swish and swallow 61m51m by mouth three times daily for 14 days   omeprazole 40 MG capsule Commonly known as: PRILOSEC Take 1 capsule by mouth twice daily   oxyCODONE-acetaminophen 10-325 MG tablet Commonly known as: PERCOCET Take 1 tablet by mouth 4 (four) times daily.   predniSONE 5 MG tablet Commonly known as: DELTASONE Take one and one-half tablet (7.5 mg) once daily as directed.   rosuvastatin 5 MG tablet Commonly known as: CRESTOR Take 5 mg by mouth daily.   sertraline 100 MG tablet Commonly known as: ZOLOFT Take 100 mg by mouth daily.   SYSTANE OP Apply to eye every 4 (four) hours.   traZODone 100 MG tablet Commonly known as: DESYREL  Take 200 mg by mouth at bedtime.   Vascepa 1 g Caps Generic drug: Icosapent Ethyl TAKE 2 CAPSULES BY MOUTH TWICE DAILY WITH FOOD SWALLOW WHOLE. DO NOT CHEW OPEN DISSOLVE AND OR CRUSH   zolpidem 12.5 MG CR tablet Commonly known as: AMBIEN CR Take 12.5 mg by mouth at bedtime.       Past Medical History:  Diagnosis Date  . Abnormal heart rhythm   . Asthma   . Chronic pain syndrome   . Common variable immunodeficiency ([place] . Congenital dysgammaglobulinemia ([facility] [place] . COPD (chronic obstructive pulmonary disease) ([facility] . Depression with anxiety   . Fatigue   . GERD (gastroesophageal reflux disease)   . Hypercholesterolemia   . Hypothyroidism   . Lymphoma ([place]  as a child  . Nonalcoholic fatty liver disease   . OSA (obstructive sleep apnea)   . Osgood-Schlatter's disease   . Other nonspecific abnormal finding of lung field   . Plantar fasciitis   . Renal stones   . Sciatica   . SCID (  severe combined immunodeficiency disease) ([place])   . Trigger finger   . Unspecified deficiency anemia   . Xerostomia     Past Surgical History:  Procedure Laterality Date  . CORONARY ARTERY BYPASS GRAFT    . LIVER SURGERY     10% of liver removed  . LYMPHADENECTOMY    . sinusectomy    . TYMPANOSTOMY TUBE PLACEMENT      Review of systems negative except as noted in HPI / PMHx or noted below:  Review of Systems  Constitutional: Negative.   HENT: Negative.   Eyes: Negative.   Respiratory: Negative.   Cardiovascular: Negative.   Gastrointestinal: Negative.   Genitourinary: Negative.   Musculoskeletal: Negative.   Skin: Negative.   Neurological: Negative.   Endo/Heme/Allergies: Negative.   Psychiatric/Behavioral: Negative.      Objective:   Vitals:   10/05/19 1344  BP: (!) 94/54  Pulse: 84  Resp: (!) 22  Temp: 98 F (36.7 C)  SpO2: 94%          Physical Exam Constitutional:      Appearance: He is not diaphoretic.  HENT:     Head: Normocephalic.     Right  Ear: Tympanic membrane, ear canal and external ear normal.     Left Ear: Tympanic membrane, ear canal and external ear normal.     Nose: Nose normal. No mucosal edema or rhinorrhea.     Mouth/Throat:     Pharynx: Uvula midline. No oropharyngeal exudate.  Eyes:     Conjunctiva/sclera: Conjunctivae normal.  Neck:     Thyroid: No thyromegaly.     Trachea: Trachea normal. No tracheal tenderness or tracheal deviation.  Cardiovascular:     Rate and Rhythm: Normal rate and regular rhythm.     Heart sounds: Normal heart sounds, S1 normal and S2 normal. No murmur.  Pulmonary:     Effort: No respiratory distress.     Breath sounds: Normal breath sounds. No stridor. No wheezing (Widespread inspiratory crackles especially on bases.  Expiratory wheezing left midlung field) or rales.  Lymphadenopathy:     Head:     Right side of head: No tonsillar adenopathy.     Left side of head: No tonsillar adenopathy.     Cervical: No cervical adenopathy.  Skin:    Findings: No erythema or rash.     Nails: There is no clubbing.   Neurological:     Mental Status: He is alert.     Diagnostics:    Spirometry was not performed.  Assessment and Plan:   1. Severe persistent asthma without complication   2. CVID (common variable immunodeficiency) (HCC)   3. Lymphopenia   4. Bronchiectasis with acute exacerbation ([place])   5. Chronic pansinusitis   6. Recurrent infections     1. Continue Privagen infusions at 100G every 3 weeks.   2.  Continue Symbicort 80 -2 inhalations 2 times per day  3. Continue Montelukast 11m one tablet one time per day   4. Continue Omeprazole 40 twice a day  5. Continue Benralizumab injections  6. Use nystatin 5 mL swish and swallow after every Symbicort use  7. Use 3 L nasal oxygen 24 hours a day  8.  Taper prednisone 5 mg tablet 1 tablet 1 time per day (7.5 mg per day)  9. Sputum for AFB smear, bacterial culture and mycobacterial culture.  10. Use bronchodilator  (Duoneb) or Proair HFA 2 puffs every 4-6 hours if needed.  11. Will discuss with  pulmonary about a bronchoscopy  12. Return to clinic in 4 weeks or earlier if probelm   13. Obtain fall flu vaccine (and COVID vaccine)     Shuayb is not improving.  In the face of a large amount of medical therapy directed against inflammation of his airway and possible infection including the use of immunoglobulin infusions to address his immunodeficiency and benralizumab to address his eosinophilia, he still continues to have very significant respiratory tract symptoms.  I am somewhat worried that he is growing a opportunistic organism in his lung as he has in the past.  We will obtain cultures as noted above and see if we can get him to get a bronchoscopy if we cannot identify any obvious bacterial or mycobacterial growth on his sputum cultures.  Systemic steroids has not really helped him very much and were slowly tapering down those medications.  He will remain on all of his other therapy as noted above and I will see him back in his clinic in 4 weeks or earlier if there is a problem.  He does have a history of systolic congestive heart failure and he will follow-up with his cardiologist regarding this issue.  Allena Katz, MD Allergy / Immunology [place]

## 2019-10-06 ENCOUNTER — Encounter: Payer: Self-pay | Admitting: Allergy and Immunology

## 2019-10-10 DIAGNOSIS — D509 Iron deficiency anemia, unspecified: Secondary | ICD-10-CM | POA: Diagnosis not present

## 2019-10-10 DIAGNOSIS — F331 Major depressive disorder, recurrent, moderate: Secondary | ICD-10-CM | POA: Diagnosis not present

## 2019-10-13 DIAGNOSIS — K8020 Calculus of gallbladder without cholecystitis without obstruction: Secondary | ICD-10-CM | POA: Diagnosis not present

## 2019-10-13 DIAGNOSIS — R160 Hepatomegaly, not elsewhere classified: Secondary | ICD-10-CM | POA: Diagnosis not present

## 2019-10-14 ENCOUNTER — Other Ambulatory Visit: Payer: Self-pay | Admitting: Allergy and Immunology

## 2019-10-18 ENCOUNTER — Telehealth: Payer: Self-pay | Admitting: Allergy and Immunology

## 2019-10-18 NOTE — Telephone Encounter (Signed)
Isaac Walsh called in and wanted to know if Gallstones caused acid reflux.  I spoke with Dr. Neldon Mc and informed Isaac Walsh that Gallstones can cause acid reflux.  Isaac Walsh then wanted me to let Dr. Neldon Mc that he is currently taking his acid reflux medication morning and night but his lungs feel like "they are on fire" throughout the day.  Isaac Walsh also states he is "eating tums like crazy" and nothing is helping.  As he coughed violently in my ear he stated he is waiting to hear back from a surgeon about removing his gallstones to which he stated he wasn't thrilled to have surgery.  Please advise.

## 2019-10-19 DIAGNOSIS — J449 Chronic obstructive pulmonary disease, unspecified: Secondary | ICD-10-CM | POA: Diagnosis not present

## 2019-10-20 ENCOUNTER — Ambulatory Visit: Payer: Self-pay

## 2019-10-20 DIAGNOSIS — R1084 Generalized abdominal pain: Secondary | ICD-10-CM | POA: Diagnosis not present

## 2019-10-21 DIAGNOSIS — R1011 Right upper quadrant pain: Secondary | ICD-10-CM | POA: Diagnosis not present

## 2019-10-24 DIAGNOSIS — B9681 Helicobacter pylori [H. pylori] as the cause of diseases classified elsewhere: Secondary | ICD-10-CM | POA: Diagnosis not present

## 2019-10-24 DIAGNOSIS — E43 Unspecified severe protein-calorie malnutrition: Secondary | ICD-10-CM | POA: Diagnosis not present

## 2019-10-24 DIAGNOSIS — M6281 Muscle weakness (generalized): Secondary | ICD-10-CM | POA: Diagnosis not present

## 2019-10-24 DIAGNOSIS — R1084 Generalized abdominal pain: Secondary | ICD-10-CM | POA: Diagnosis not present

## 2019-10-24 DIAGNOSIS — G251 Drug-induced tremor: Secondary | ICD-10-CM | POA: Diagnosis not present

## 2019-10-24 NOTE — Telephone Encounter (Signed)
Please inform patient that if his gallbladder is not functioning correctly it should probably come out.  Almost all of the surgical procedures involved with removing the gallbladder are laparoscopic with 3 small incisions across the abdomen to remove the gallbladder.  We will discuss this further during his upcoming visit in the beginning of November.  Did he have his sputum delivered to [facility] or [facility] for evaluation as I never saw those results?

## 2019-10-24 NOTE — Telephone Encounter (Signed)
Isaac Walsh stated that he is aware that his gallbladder needs to come out, but he is having a more serious issue occurring that needs to be taken care of first. He states he has been having "convulsions in his head, hands and knees and having to use a walker." He mentioned that he does have an appointment scheduled for today in about an hour with his PCP which he plans on having the convulsions addressed. He also stated that he has not done the sputum culture yet.

## 2019-10-28 DIAGNOSIS — J449 Chronic obstructive pulmonary disease, unspecified: Secondary | ICD-10-CM | POA: Diagnosis not present

## 2019-10-30 DEATH — deceased

## 2019-11-02 ENCOUNTER — Ambulatory Visit: Payer: Medicare HMO | Admitting: Allergy and Immunology

## 2021-02-20 IMAGING — CT CT PARANASAL SINUSES LIMITED
3 series · 14 of 47 positions shown, 16 images · non-contrast
Comparison: None.

CLINICAL DATA: COPD.  Bronchiectasis.  Sinus drainage.

EXAM:
CT PARANASAL SINUS LIMITED WITHOUT CONTRAST
TECHNIQUE: Non-contiguous multidetector CT images of the paranasal sinuses were
obtained in a single plane without contrast.

[Series 4: sinus 2.0 j30s 2 · axial · 0.26mm/px · z∈[+70,+156]mm · 8 of 51 slices shown, 10 images]
[im 4/51  brain]
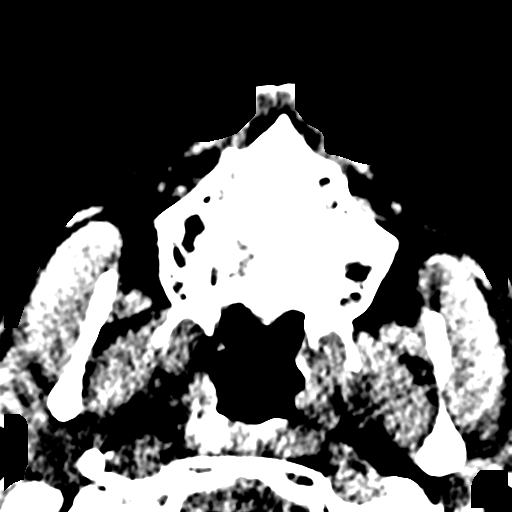
[im 4/51  bone]
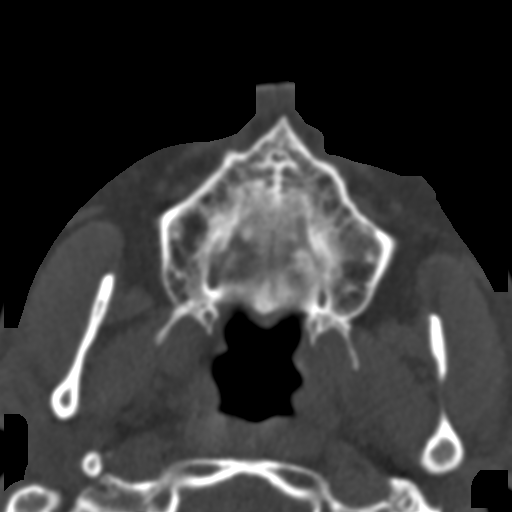
[im 11/51  bone]
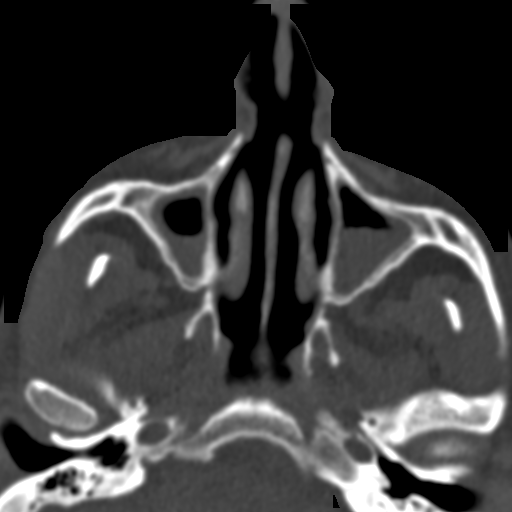
[im 16/51  bone]
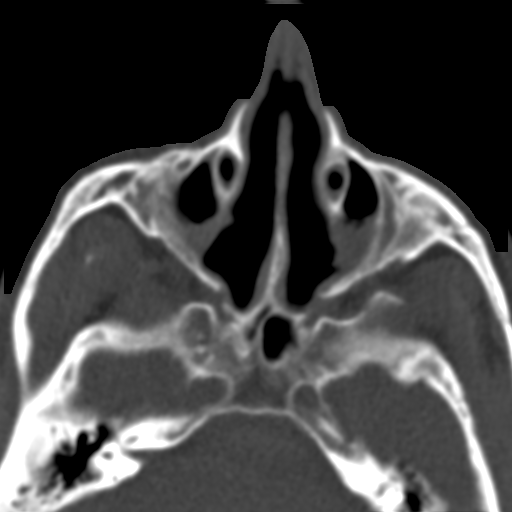
[im 23/51  bone]
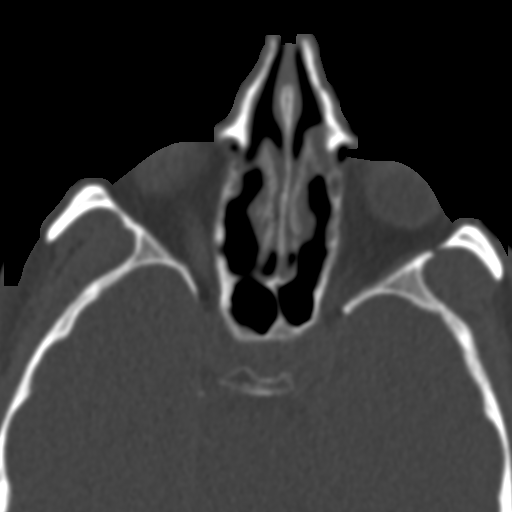
[im 28/51  brain]
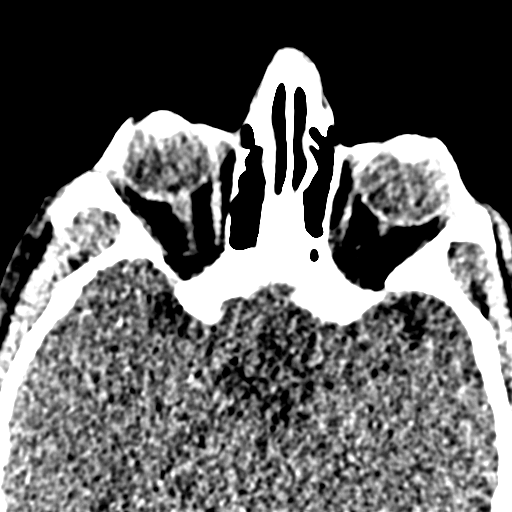
[im 28/51  bone]
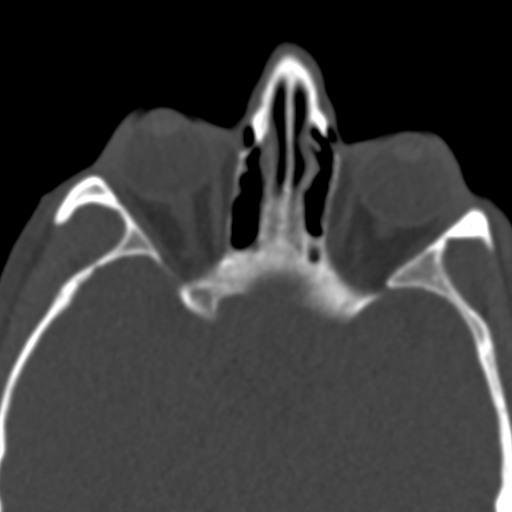
[im 35/51  bone]
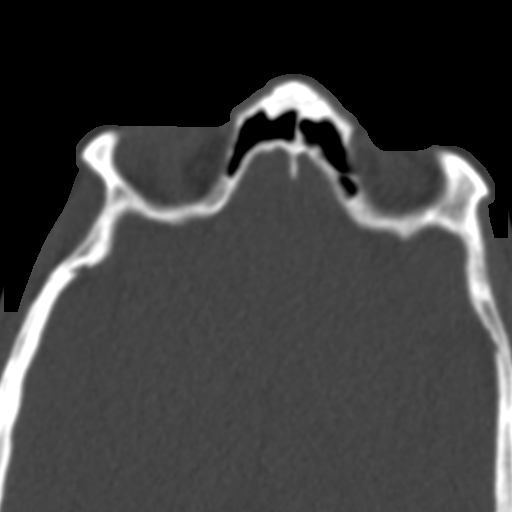
[im 40/51  bone]
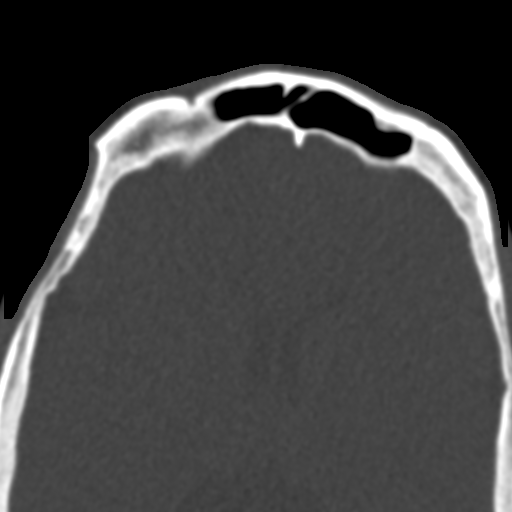
[im 47/51  bone]
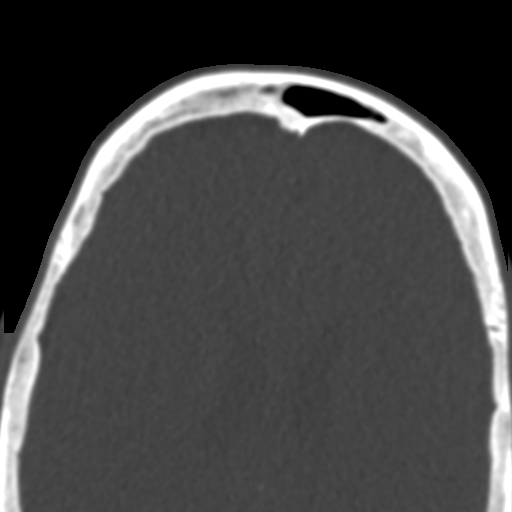

[Series 5: coronal bone · coronal · 0.23mm/px · 3 of 98 slices shown]
[im 33/98  bone]
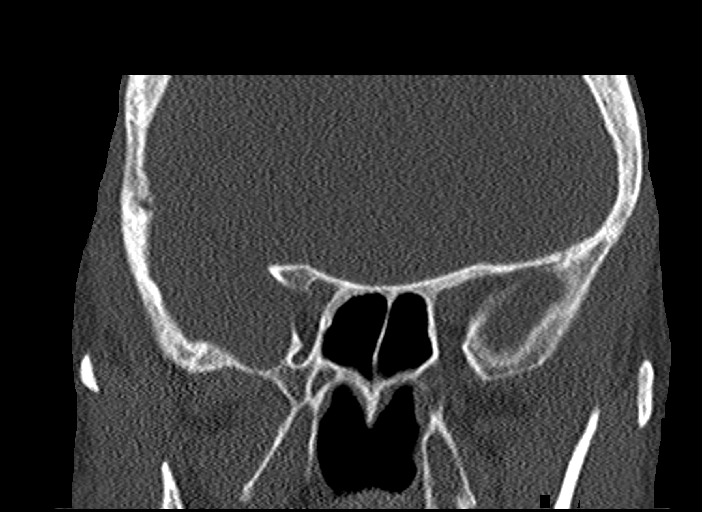
[im 44/98  bone]
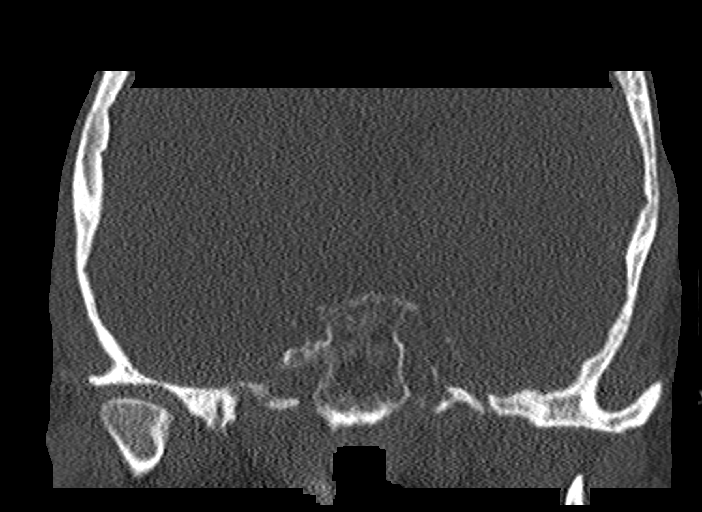
[im 54/98  bone]
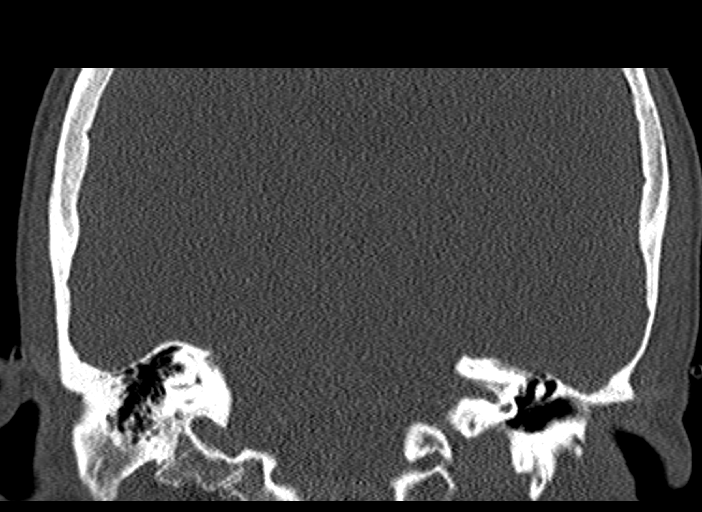

[Series 6: sagittal bone · sagittal · 0.22mm/px · 3 of 85 slices shown]
[im 29/85  bone]
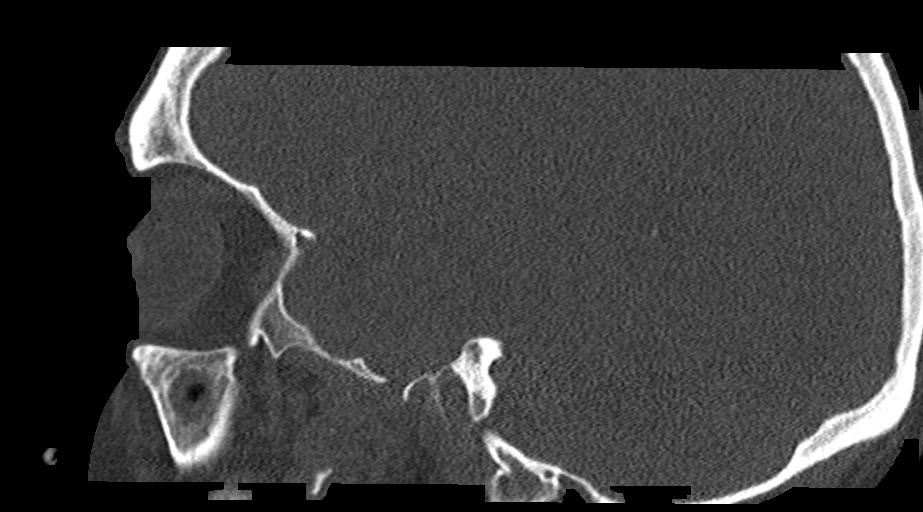
[im 43/85  bone]
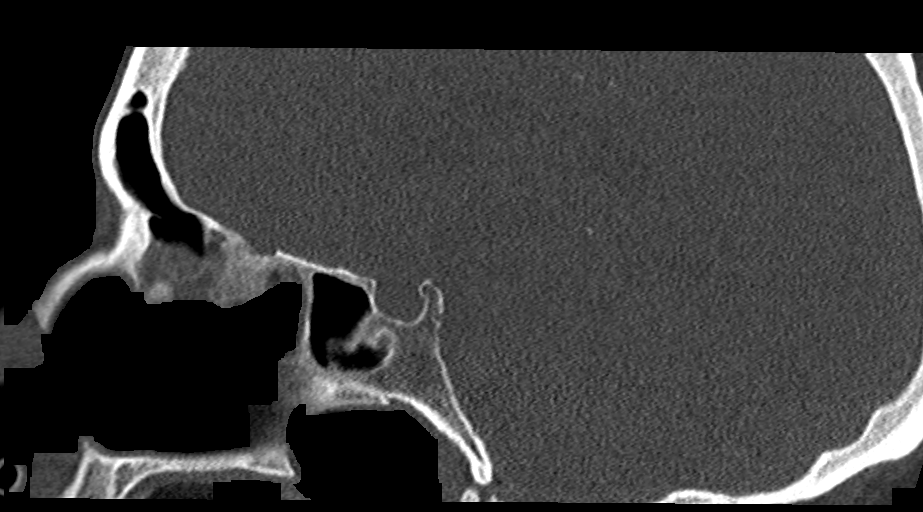
[im 57/85  bone]
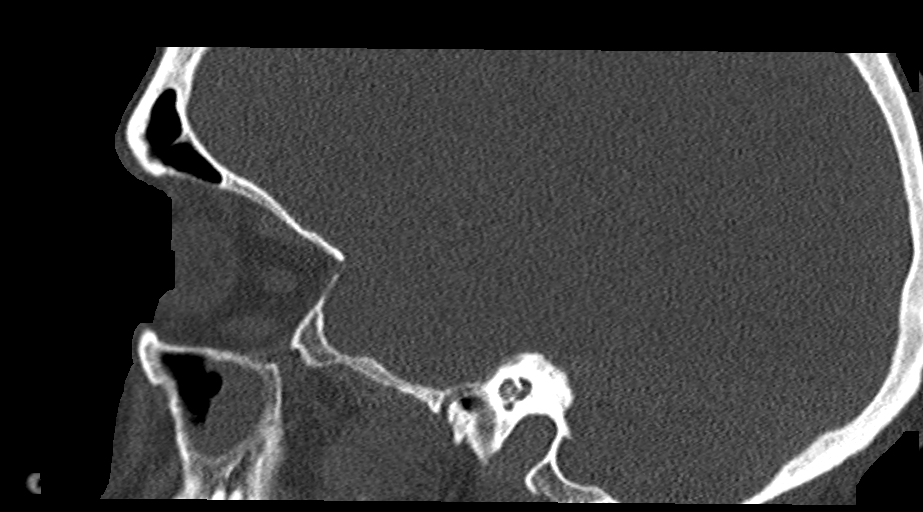

[14 of 47 positions shown; findings below may reference images not displayed]

FINDINGS: Frontal sinuses are clear. The patient has had previous functional
endoscopic sinus surgery with ethmoidectomy and nasoantral windows.
There is mucosal thickening throughout the postsurgical ethmoid
regions. There is circumferential mucosal thickening of the
maxillary sinuses with layering fluid. Sphenoid sinus is clear.
IMPRESSION: Previous functional endoscopic sinus surgery. Maxillary sinusitis
with circumferential mucosal thickening and layering fluid levels.
Mucosal thickening in the postsurgical ethmoid regions.
